# Patient Record
Sex: Female | Born: 1965 | Hispanic: No | Marital: Married
Health system: Midwestern US, Academic
[De-identification: ages and names within clinical notes are randomized; demographics above are authoritative.]

---

## 2020-08-07 ENCOUNTER — Encounter: Admit: 2020-08-07 | Discharge: 2020-08-07 | Payer: BC Managed Care – PPO

## 2020-08-07 NOTE — Telephone Encounter
08/07/20 - Records have been requested per work que task / sjg  ________________________________    1) please collect records from PCP Dr Erskine Emery [phone]     2) please collect records from [facility] hospital  (P) [phone]

## 2020-08-26 ENCOUNTER — Encounter: Admit: 2020-08-26 | Discharge: 2020-08-26 | Payer: BC Managed Care – PPO

## 2020-08-27 ENCOUNTER — Encounter: Admit: 2020-08-27 | Discharge: 2020-08-27 | Payer: BC Managed Care – PPO

## 2020-08-27 DIAGNOSIS — R0602 Shortness of breath: Secondary | ICD-10-CM

## 2020-08-27 DIAGNOSIS — R931 Abnormal findings on diagnostic imaging of heart and coronary circulation: Secondary | ICD-10-CM

## 2020-08-27 DIAGNOSIS — G620 Drug-induced polyneuropathy: Secondary | ICD-10-CM

## 2020-08-27 DIAGNOSIS — Z8249 Family history of ischemic heart disease and other diseases of the circulatory system: Secondary | ICD-10-CM

## 2020-08-27 DIAGNOSIS — R0789 Other chest pain: Secondary | ICD-10-CM

## 2020-08-27 DIAGNOSIS — E7800 Pure hypercholesterolemia, unspecified: Secondary | ICD-10-CM

## 2020-08-27 DIAGNOSIS — Z9189 Other specified personal risk factors, not elsewhere classified: Secondary | ICD-10-CM

## 2020-08-27 DIAGNOSIS — R079 Chest pain, unspecified: Secondary | ICD-10-CM

## 2020-08-27 DIAGNOSIS — K824 Cholesterolosis of gallbladder: Secondary | ICD-10-CM

## 2020-08-27 DIAGNOSIS — R0609 Other forms of dyspnea: Secondary | ICD-10-CM

## 2020-08-27 MED ORDER — CRESTOR 20 MG PO TAB
20 mg | ORAL_TABLET | Freq: Every day | ORAL | 3 refills | 90.00000 days | Status: AC
Start: 2020-08-27 — End: ?

## 2020-08-27 NOTE — Progress Notes
Date of Service: 08/27/2020    Lynn Hall is a 55 y.o. female.       HPI     Lynn Hall is a 55 year old white female that has been experiencing symptoms of shortness of breath, these occur with and without physical activity, she also has been experiencing atypical chest pain, again it is not related to physical activity, it does necessarily radiate across the precordium and it is very short lasting.      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    Patient was evaluated with a calcium score on 12/09/2019, it was 1 and found in the right coronary artery.    Patient was evaluated with a fasting lipid and liver profile, this lab work is dated 08/06/2020, it demonstrated the following: Total cholesterol 256 mg/dL, LDL 161 mg/dL, HDL 46 mg/dL, and triglycerides 096 mg/dL.    Family history: It is significant for coronary artery disease, patient's father did have a myocardial infarction, he underwent CABG and has congestive heart failure.    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  She does not smoke cigarettes and does not drink alcohol.         Vitals:    08/27/20 0841   BP: 128/72   BP Source: Arm, Left Upper   Pulse: 91   SpO2: 97%   O2 Device: None (Room air)   PainSc: Zero   Weight: 72.8 kg (160 lb 9.6 oz)   Height: 157.5 cm (5' 2)     Body mass index is 29.37 kg/m?Marland Kitchen     Past Medical History  Patient Active Problem List    Diagnosis Date Noted   ? Anxiety 08/26/2020   ? Abdominal pain 08/26/2020   ? Depression 08/26/2020   ? Fatigue 08/26/2020   ? GERD without esophagitis 08/26/2020   ? High blood cholesterol 08/26/2020   ? Vertigo 08/26/2020   ? Peripheral neuropathy 08/26/2020         Review of Systems   Constitutional: Negative.   HENT: Negative.    Eyes: Negative.    Cardiovascular: Negative.    Respiratory: Positive for shortness of breath.    Endocrine: Negative.    Hematologic/Lymphatic: Negative.    Skin: Negative.    Musculoskeletal: Negative.    Gastrointestinal: Positive for heartburn.   Genitourinary: Negative.    Neurological: Positive for dizziness and light-headedness.   Psychiatric/Behavioral: Negative.    Allergic/Immunologic: Negative.        Physical Exam  General Appearance: normal in appearance  Skin: warm, moist, no ulcers or xanthomas  Eyes: conjunctivae and lids normal, pupils are equal and round  Lips & Oral Mucosa: no pallor or cyanosis  Neck Veins: neck veins are flat, neck veins are not distended  Chest Inspection: chest is normal in appearance  Respiratory Effort: breathing comfortably, no respiratory distress  Auscultation/Percussion: lungs clear to auscultation, no rales or rhonchi, no wheezing  Cardiac Rhythm: regular rhythm and normal rate  Cardiac Auscultation: S1, S2 normal, no rub, no gallop  Murmurs: no murmur  Carotid Arteries: normal carotid upstroke bilaterally, no bruit  Lower Extremity Edema: no lower extremity edema  Abdominal Exam: soft, non-tender, no masses, bowel sounds normal  Liver & Spleen: no organomegaly  Language and Memory: patient responsive and seems to  comprehend information  Neurologic Exam: neurological assessment grossly intact      Cardiovascular Studies    Twelve-lead EKG demonstrates normal sinus rhythm, no ST segment T wave changes, probably leads V2-V3 inverted, no axis deviation, ventricular rate is 72 bpm.    Cardiovascular Health Factors  Vitals BP Readings from Last 3 Encounters:   08/27/20 128/72   03/20/14 119/68   02/28/14 134/77     Wt Readings from Last 3 Encounters:   08/27/20 72.8 kg (160 lb 9.6 oz)   03/20/14 64 kg (141 lb)   02/28/14 67.5 kg (148 lb 13 oz)     BMI Readings from Last 3 Encounters:   08/27/20 29.37 kg/m?   03/20/14 25.79 kg/m?   02/28/14 27.22 kg/m?      Smoking Social History Tobacco Use   Smoking Status Never Smoker   Smokeless Tobacco Never Used      Lipid Profile No results found for: CHOL  No results found for: HDL  No results found for: LDL  No results found for: TRIG   Blood Sugar No results found for: HGBA1C  Glucose   Date Value Ref Range Status   02/13/2014 79 70 - 100 MG/DL Final   16/11/9602 99 70 - 100 MG/DL Final          Problems Addressed Today  Encounter Diagnoses   Name Primary?   ? Pure hypercholesterolemia Yes   ? Family history of coronary artery bypass graft surgery    ? DOE (dyspnea on exertion)    ? Chest pain, unspecified type    ? High blood cholesterol    ? Drug-induced polyneuropathy (HCC)    ? Family history of coronary artery disease    ? Agatston CAC score, <100    ? Shortness of breath    ? Atypical chest pain    ? Framingham cardiac risk 10-20% in next 10 years        Assessment and Plan     Assessment:    1.  Symptoms of shortness of breath and atypical chest pain-to some extent this could represent an anginal equivalent  2.  Hyperlipidemia-at present time patient is not on statin therapy  3.  Elevated Framingham risk score- estimated 10-year global CVD risk is 10%, patient is in the moderate risk category, it is recommended that the LDL is less than 130 mg/dL  4.  Family history of coronary artery disease-patient's father did suffer an MI, he underwent CABG and presently has congestive heart failure  5.  Patient is moderately overweight-BMI 29.37 kg/m?  6.  History of cervical cancer-patient did undergo chemo/radiation therapy, she is currently in complete remission  7.  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  Start rosuvastatin 20 mg p.o. daily  2.  Overall risk factors modification, low-fat, low-cholesterol, low sugar, low carbohydrate, low-sodium diet  3.  Patient will be evaluated with a 2D echo Doppler study and an exercise stress echocardiogram-we will follow-up on the results of this test and will call the patient with further recommendations  4.  Fasting lipid and liver profile and follow-up office visit in 3 to 4 months.      Total Time Today was 45 minutes in the following activities: Preparing to see the patient, Obtaining and/or reviewing separately obtained history, Performing a medically appropriate examination and/or evaluation, Counseling and educating the patient/family/caregiver, Ordering medications, tests, or procedures, Referring and communication with  other health care professionals (when not separately reported), Documenting clinical information in the electronic or other health record, Independently interpreting results (not separately reported) and communicating results to the patient/family/caregiver and Care coordination (not separately reported)         Current Medications (including today's revisions)  ? ALPRAZolam (XANAX) 0.25 mg tablet Take 0.25 mg by mouth daily as needed.   ? ibuprofen (MOTRIN) 200 mg tablet Take 200 mg by mouth every 6 hours as needed for Pain.   ? ondansetron (ZOFRAN ODT) 8 mg rapid dissolve tablet as Needed.   ? pantoprazole DR (PROTONIX) 40 mg tablet Take 40 mg by mouth daily.   ? pregabalin (LYRICA) 50 mg capsule Take 50 mg by mouth twice daily.

## 2020-08-27 NOTE — Patient Instructions
Crestor  Stress echo  Labs before follow up

## 2020-08-28 ENCOUNTER — Encounter: Admit: 2020-08-28 | Discharge: 2020-08-28 | Payer: BC Managed Care – PPO

## 2020-08-28 MED ORDER — ROSUVASTATIN 20 MG PO TAB
20 mg | ORAL_TABLET | Freq: Every day | ORAL | 3 refills | 90.00000 days | Status: AC
Start: 2020-08-28 — End: ?

## 2020-08-28 NOTE — Telephone Encounter
Pt calling nursing line having difficulty getting rosuvastatin filled from last OV. Pharmacy requesting medication to be reordered. Previously ordered as just brand name only.

## 2020-09-03 ENCOUNTER — Encounter: Admit: 2020-09-03 | Discharge: 2020-09-03 | Payer: BC Managed Care – PPO

## 2020-09-03 NOTE — Telephone Encounter
Message received that the patient's insurance is requesting a peer to peer for the the patient's resting echo.   Carolee Rota Cvm Nurse [place]  Peer to Peer:     Name: Lynn Hall   MRN: [id number]     The above patient's carrier has requested a peer to peer be completed for your patient's 2D + DOPPLER ECHO scheduled on 09/29/2020. Please review the information below:     Ref/Cert#: [id number]   Payor Phone: [phone]   Peer to Peer Deadline Date:09/04/2020   Peer to Peer Reason: did not meet criteria     Please let us know when this is complete and provide the approval number if applicable. It is imperative that the peer to peer be completed before the deadline or the carrier will automatically deny the exam.     Thank you,     Mannie Stabile   [phone]     Payor phone number called as requested. Verified reasons for testing and payor rep gave RQI number [id number] good for echo as of 09/02/2020 until 10/01/2020. This information sent back to [facility].

## 2020-09-29 ENCOUNTER — Ambulatory Visit: Admit: 2020-09-29 | Discharge: 2020-09-29 | Payer: BC Managed Care – PPO

## 2020-09-29 ENCOUNTER — Encounter: Admit: 2020-09-29 | Discharge: 2020-09-29 | Payer: BC Managed Care – PPO

## 2020-09-29 DIAGNOSIS — Z8249 Family history of ischemic heart disease and other diseases of the circulatory system: Secondary | ICD-10-CM

## 2020-09-29 DIAGNOSIS — R079 Chest pain, unspecified: Secondary | ICD-10-CM

## 2020-09-29 DIAGNOSIS — R0609 Other forms of dyspnea: Secondary | ICD-10-CM

## 2020-09-29 DIAGNOSIS — E7800 Pure hypercholesterolemia, unspecified: Secondary | ICD-10-CM

## 2020-09-29 MED ORDER — PERFLUTREN LIPID MICROSPHERES 1.1 MG/ML IV SUSP
1-10 mL | Freq: Once | INTRAVENOUS | 0 refills | Status: CP | PRN
Start: 2020-09-29 — End: ?
  Administered 2020-09-29: 20:00:00 5 mL via INTRAVENOUS

## 2020-10-01 ENCOUNTER — Encounter: Admit: 2020-10-01 | Discharge: 2020-10-01 | Payer: BC Managed Care – PPO

## 2020-10-01 NOTE — Telephone Encounter
-----   Message from Dorris Fetch, MD sent at 09/30/2020  5:10 PM CDT -----  Please call the patient and let her know that the resting echocardiogram and a stress echo did not show any abnormalities, the heart function was normal, it was no evidence of blockages.    I did put her on cholesterol medication and I suggested to recheck the fasting lipid and liver profile in 3 months and return to see me.  If she wants to continue with her PCP on this issue that is fine, if she would like to come back and see me that is fine as well.    Thank you      ----- Message -----  From: Hajj, Alfredo Martinez, MD  Sent: 09/29/2020   4:32 PM CDT  To: Dorris Fetch, MD

## 2020-10-01 NOTE — Telephone Encounter
Left voicemail with test results, reminder of upcoming lab due and scheduled appointment. Callback number provided for further questions or concerns.

## 2020-10-22 ENCOUNTER — Encounter: Admit: 2020-10-22 | Discharge: 2020-10-22 | Payer: BC Managed Care – PPO

## 2021-01-04 ENCOUNTER — Encounter: Admit: 2021-01-04 | Discharge: 2021-01-04 | Payer: BC Managed Care – PPO

## 2021-01-04 DIAGNOSIS — Z8249 Family history of ischemic heart disease and other diseases of the circulatory system: Secondary | ICD-10-CM

## 2021-01-04 DIAGNOSIS — E7800 Pure hypercholesterolemia, unspecified: Secondary | ICD-10-CM

## 2021-01-04 DIAGNOSIS — R079 Chest pain, unspecified: Secondary | ICD-10-CM

## 2021-01-04 DIAGNOSIS — R0609 Other forms of dyspnea: Secondary | ICD-10-CM

## 2021-01-04 LAB — LIVER FUNCTION PANEL
ALBUMIN: 4.1
ALK PHOSPHATASE: 128
ALT: 19
AST: 18
DIRECT BILIRUBIN: 0.1
TOTAL BILIRUBIN: 0.4

## 2021-01-04 LAB — LIPID PROFILE
CHOLESTEROL/HDL %: 3
CHOLESTEROL: 163
HDL: 64
LDL: 81
TRIGLYCERIDES: 93
VLDL: 19

## 2021-01-07 ENCOUNTER — Encounter: Admit: 2021-01-07 | Discharge: 2021-01-07 | Payer: BC Managed Care – PPO

## 2021-03-01 ENCOUNTER — Encounter: Admit: 2021-03-01 | Discharge: 2021-03-01 | Payer: BC Managed Care – PPO

## 2021-03-02 ENCOUNTER — Encounter: Admit: 2021-03-02 | Discharge: 2021-03-02 | Payer: BC Managed Care – PPO

## 2021-05-28 ENCOUNTER — Encounter: Admit: 2021-05-28 | Discharge: 2021-05-28 | Payer: BC Managed Care – PPO

## 2021-05-31 ENCOUNTER — Encounter: Admit: 2021-05-31 | Discharge: 2021-05-31 | Payer: BC Managed Care – PPO

## 2021-05-31 DIAGNOSIS — R053 Chronic cough: Secondary | ICD-10-CM

## 2021-07-15 ENCOUNTER — Encounter: Admit: 2021-07-15 | Discharge: 2021-07-15 | Payer: BC Managed Care – PPO

## 2021-07-15 NOTE — Telephone Encounter
RN attached recs from PCP. RN also requested images to be clouded via [employer]

## 2021-07-19 ENCOUNTER — Encounter: Admit: 2021-07-19 | Discharge: 2021-07-19 | Payer: BC Managed Care – PPO

## 2021-07-19 ENCOUNTER — Ambulatory Visit: Admit: 2021-07-19 | Discharge: 2021-07-19 | Payer: BC Managed Care – PPO

## 2021-07-19 DIAGNOSIS — J329 Chronic sinusitis, unspecified: Secondary | ICD-10-CM

## 2021-07-19 DIAGNOSIS — Z8249 Family history of ischemic heart disease and other diseases of the circulatory system: Secondary | ICD-10-CM

## 2021-07-19 DIAGNOSIS — R053 Chronic cough: Secondary | ICD-10-CM

## 2021-07-19 DIAGNOSIS — K824 Cholesterolosis of gallbladder: Secondary | ICD-10-CM

## 2021-07-19 MED ORDER — ALBUTEROL SULFATE 90 MCG/ACTUATION IN HFAA
2 | RESPIRATORY_TRACT | 11 refills | Status: AC | PRN
Start: 2021-07-19 — End: ?

## 2021-07-19 NOTE — Patient Instructions
Clinic Visit Summary:     Today we discussed your cough in pulmonary clinic.  1) Your lung function testing was largely normal  2) Your CT scan of your chest was also normal  3) I would like you to start sinus rinses 1-2x daily prior to your flonase  4) I have sent an albuterol inhaler to your pharmacy which you can use as needed  5) I would like to follow up in clinic in 2-3 months to reassess your symtpoms  6) I have sent a referral to ENT    It was a pleasure to see you today, please don't hesitate to call with any future questions or concerns.      For questions or concerns,  please contact my Clinical Nurse Coordinator at [phone] or send a My Chart message.     Please contact my nurse with any signs and symptoms of worsening productive cough with thick secretions, blood in sputum, chest pain or tightness, shortness of breath, fever, chills, night sweats, or any other pulmonary concerns.     -  For URGENT issues after business hours, weekends, or holidays: Call [phone] and request for the Pulmonary Fellow to be paged.    -  For medication refills, please ask your pharmacy to send an electronic request.  Please allow at least 3 business days for medication refills.      -  For equipment issues, please contact your Durable Medical Equipment (DME) company directly.    -  To cancel, change, or schedule a clinic appointment: Call Pulmonary Scheduling at [phone].

## 2021-07-19 NOTE — Progress Notes
Pulmonary Clinic Note    Date of Service: 07/19/2021  Referring Physician: Burnadette Pop, MD  PCP: Erskine Emery, MD     Subjective      Ms Wakley is a 55yo female with PMH of cervical cancer s/p chemotherapy c/b peripheral neuropathy  who presents today for evaluation of cough.      She reports walking pneumonia around January, and was treated with steroids and antibiotics.  She continued to have a cough so was given a steroid shot and antibiotics which improved her symptoms.  She thinks that the antibiotics help with her cough.  She got another steroid shot and augmentin about 2 weeks ago which again helped with her symptoms.  Her main symptoms are Abdulwahab Demelo grade fevers, about 99-100 which will be accompanied by a headache.  She will take ibuprofen which sometimes helps with the headache.  She will also have a cough which is worse with exertion.  She will get mildly dyspneic walking up stairs and her chest will feel tight.      She currently uses Flonase twice daily and reports that she has used this for a long time.  She does not know if it helps with her cough at this point.  She has previous use sinus rinses when she has COVID however has not been using them recently.  She does report increased sinus congestion and pressure over the past few months, has never seen ENT.  She does not notice any positional aspect of her cough, denies any nighttime cough and does not have any cough with eating.  She reports a cough occasionally productive of sputum which is yellow in color, has never seen a mopped assist.  She was reported history of tooth abscesses and had 2 teeth removed beginning of this year.  She does think that her symptoms had started prior to the removal of her teeth.    She is a never smoker, denies any recreational drug use.          ROS  Review of Systems   Constitutional: Positive for fever. Negative for chills.   HENT: Negative for congestion and sinus pain.    Eyes: Negative for blurred vision and double vision.   Respiratory: Positive for cough, sputum production and shortness of breath. Negative for hemoptysis.    Cardiovascular: Negative for chest pain, palpitations and leg swelling.   Gastrointestinal: Negative for constipation, diarrhea, nausea and vomiting.   Genitourinary: Negative for dysuria, frequency and urgency.   Musculoskeletal: Negative for joint pain and myalgias.   Skin: Negative for itching and rash.   Neurological: Negative for dizziness and headaches.   Psychiatric/Behavioral: Negative for depression and suicidal ideas.        Objective:         ? albuterol sulfate (PROAIR HFA) 90 mcg/actuation HFA aerosol inhaler Inhale two puffs by mouth into the lungs every 6 hours as needed for Wheezing or Shortness of Breath. Shake well before use.   ? ALPRAZolam (XANAX) 0.25 mg tablet Take one tablet by mouth daily as needed.   ? ibuprofen (MOTRIN) 200 mg tablet Take one tablet by mouth every 6 hours as needed for Pain.   ? ondansetron (ZOFRAN ODT) 8 mg rapid dissolve tablet as Needed.   ? pantoprazole DR (PROTONIX) 40 mg tablet Take one tablet by mouth daily.   ? pregabalin (LYRICA) 50 mg capsule Take one capsule by mouth twice daily.   ? rosuvastatin (CRESTOR) 20 mg tablet Take one tablet by mouth  daily.     Vitals:    07/19/21 1322   BP: 138/75   BP Source: Arm, Left Upper   Pulse: 67   Temp: 36.9 ?C (98.5 ?F)   Resp: 16   SpO2: 99%   PainSc: Zero   Weight: 70.7 kg (155 lb 12.8 oz)   Height: 157.5 cm (5' 2)     Body mass index is 28.5 kg/m?Marland Kitchen     Physical Exam  Vitals reviewed.   Constitutional:       General: She is not in acute distress.  HENT:      Head: Normocephalic and atraumatic.      Right Ear: External ear normal.      Left Ear: External ear normal.      Mouth/Throat:      Mouth: Mucous membranes are moist.      Pharynx: Oropharynx is clear.   Eyes:      Extraocular Movements: Extraocular movements intact.      Pupils: Pupils are equal, round, and reactive to light.   Cardiovascular:      Rate and Rhythm: Normal rate.      Heart sounds: No murmur heard.     No friction rub. No gallop.   Pulmonary:      Effort: Pulmonary effort is normal.      Breath sounds: No stridor. No wheezing or rales.   Abdominal:      General: Abdomen is flat. Bowel sounds are normal. There is no distension.      Palpations: Abdomen is soft.      Tenderness: There is no abdominal tenderness.   Musculoskeletal:      Cervical back: Normal range of motion and neck supple.      Right lower leg: No edema.      Left lower leg: No edema.   Skin:     General: Skin is warm and dry.      Findings: No rash.   Neurological:      General: No focal deficit present.      Mental Status: She is alert and oriented to person, place, and time.      Cranial Nerves: No cranial nerve deficit (grossly).                  Assessment and Plan:  Chronic Cough: Patient presents today with about 104-month history of episodic cough which will improve with steroids and antibiotics.  CXR 03/26/21 without infiltrate/consolidation.  CT Chest 04/09/21 without parenchymal disease.  PFTs today do not show any evidence of obstructive pattern, do show mild restriction with TLC 77% and a symmetric reduction in FEV1 and FVC on spirometry, DLCO was normal at 92%.  Of note she does have intermittent truncation of her inspiratory loop which could suggest vocal cord dysfunction as a cause of her cough.  Given her normal CT scan, the likelihood of fibrotic lung disease as a cause of her restriction and symptoms is unlikely.  She does report some sinus congestion and pressure and combined with her inspiratory loop truncation, I would presume postnasal drip or recurrent sinusitis is most likely cause of her cough.  She is already on intranasal steroids with Flonase twice daily, we discussed adding sinus rinses as well.  We will also place referral for ENT for further evaluation.  We can try short acting bronchodilator to see if this improves her symptoms, if she does have improvement on albuterol then could discuss starting an inhaled steroid.  -Sinus rinses 1-2  times daily prior to [facility]  -ENT referral placed  -Albuterol inhaler ordered      Follow up in 3 months or sooner as needed.        Jule Economy, MD

## 2021-12-21 ENCOUNTER — Encounter: Admit: 2021-12-21 | Discharge: 2021-12-21

## 2021-12-21 ENCOUNTER — Inpatient Hospital Stay: Admit: 2021-12-21 | Discharge: 2021-12-21

## 2021-12-21 ENCOUNTER — Ambulatory Visit: Admit: 2021-12-21 | Discharge: 2021-12-21

## 2021-12-21 ENCOUNTER — Inpatient Hospital Stay: Admit: 2021-12-21

## 2021-12-21 DIAGNOSIS — J9601 Acute respiratory failure with hypoxia: Secondary | ICD-10-CM

## 2021-12-21 DIAGNOSIS — Z9889 Other specified postprocedural states: Secondary | ICD-10-CM

## 2021-12-21 DIAGNOSIS — D62 Acute posthemorrhagic anemia: Secondary | ICD-10-CM

## 2021-12-21 DIAGNOSIS — E872 Lactic acidosis: Secondary | ICD-10-CM

## 2021-12-21 DIAGNOSIS — I10 Essential (primary) hypertension: Secondary | ICD-10-CM

## 2021-12-21 DIAGNOSIS — I469 Cardiac arrest, cause unspecified: Secondary | ICD-10-CM

## 2021-12-21 DIAGNOSIS — I2510 Atherosclerotic heart disease of native coronary artery without angina pectoris: Secondary | ICD-10-CM

## 2021-12-21 DIAGNOSIS — E785 Hyperlipidemia, unspecified: Secondary | ICD-10-CM

## 2021-12-21 DIAGNOSIS — R739 Hyperglycemia, unspecified: Secondary | ICD-10-CM

## 2021-12-21 DIAGNOSIS — R578 Other shock: Secondary | ICD-10-CM

## 2021-12-21 DIAGNOSIS — J810 Acute pulmonary edema: Secondary | ICD-10-CM

## 2021-12-21 DIAGNOSIS — Z9911 Dependence on respirator [ventilator] status: Secondary | ICD-10-CM

## 2021-12-21 DIAGNOSIS — I2111 ST elevation (STEMI) myocardial infarction involving right coronary artery: Secondary | ICD-10-CM

## 2021-12-21 DIAGNOSIS — I213 ST elevation (STEMI) myocardial infarction of unspecified site: Secondary | ICD-10-CM

## 2021-12-21 DIAGNOSIS — K824 Cholesterolosis of gallbladder: Secondary | ICD-10-CM

## 2021-12-21 DIAGNOSIS — Z9281 Personal history of extracorporeal membrane oxygenation (ECMO): Secondary | ICD-10-CM

## 2021-12-21 DIAGNOSIS — K7200 Acute and subacute hepatic failure without coma: Secondary | ICD-10-CM

## 2021-12-21 DIAGNOSIS — E876 Hypokalemia: Secondary | ICD-10-CM

## 2021-12-21 DIAGNOSIS — R748 Abnormal levels of other serum enzymes: Secondary | ICD-10-CM

## 2021-12-21 DIAGNOSIS — Z8249 Family history of ischemic heart disease and other diseases of the circulatory system: Secondary | ICD-10-CM

## 2021-12-21 DIAGNOSIS — R570 Cardiogenic shock: Secondary | ICD-10-CM

## 2021-12-21 LAB — COMPREHENSIVE METABOLIC PANEL
ALBUMIN: 2.8 g/dL — ABNORMAL LOW (ref 3.5–5.0)
ALBUMIN: 1.8 g/dL — ABNORMAL LOW (ref 3.5–5.0)
ALBUMIN: 3 g/dL — ABNORMAL LOW (ref 3.5–5.0)
ALK PHOSPHATASE: 175 U/L — ABNORMAL HIGH (ref 25–110)
ALK PHOSPHATASE: 48 U/L — ABNORMAL LOW (ref 25–110)
ALK PHOSPHATASE: 53 U/L (ref 25–110)
ALK PHOSPHATASE: 50 U/L (ref 25–110)
ALT: 360 U/L — ABNORMAL HIGH (ref 7–56)
ALT: 205 U/L — ABNORMAL HIGH (ref 7–56)
ALT: 281 U/L — ABNORMAL HIGH (ref 7–56)
ANION GAP: 34 K/UL — ABNORMAL HIGH (ref 3–12)
ANION GAP: 20 — ABNORMAL HIGH (ref 3–12)
ANION GAP: 16 — ABNORMAL HIGH (ref 3–12)
AST: 277 U/L — ABNORMAL HIGH (ref 7–40)
AST: 579 U/L — ABNORMAL HIGH (ref 7–40)
AST: 661 U/L — ABNORMAL HIGH (ref 7–40)
BLD UREA NITROGEN: 22 mg/dL (ref 7–25)
CALCIUM: 10 mg/dL (ref 8.5–10.6)
CALCIUM: 8.6 mg/dL (ref 8.5–10.6)
CHLORIDE: 107 MMOL/L — ABNORMAL LOW (ref 98–110)
CO2: 17 MMOL/L — ABNORMAL LOW (ref 21–30)
CO2: 23 MMOL/L (ref 21–30)
CO2: 24 MMOL/L (ref 21–30)
CREATININE: 1.2 mg/dL — ABNORMAL HIGH (ref 0.4–1.00)
CREATININE: 1.1 mg/dL — ABNORMAL HIGH (ref >60)
CREATININE: 1.2 mg/dL — ABNORMAL HIGH (ref 0.4–1.00)
EGFR: >60 mL/min (ref >60)
EGFR: 53 mL/min — ABNORMAL LOW (ref >60)
EGFR: 51 mL/min — ABNORMAL LOW (ref >60)
EGFR: 58 mL/min — ABNORMAL LOW (ref >60)
EGFR: 53 mL/min — ABNORMAL LOW (ref >60)
GLUCOSE,PANEL: 340 mg/dL — ABNORMAL HIGH (ref 70–100)
GLUCOSE,PANEL: 321 mg/dL — ABNORMAL HIGH (ref 70–100)
POTASSIUM: 2.9 MMOL/L — ABNORMAL LOW (ref 3.5–5.1)
SODIUM: 145 MMOL/L — ABNORMAL HIGH (ref 137–147)
SODIUM: 158 MMOL/L — ABNORMAL HIGH (ref 137–147)
SODIUM: 152 MMOL/L — ABNORMAL HIGH (ref 137–147)
SODIUM: 156 MMOL/L — ABNORMAL HIGH (ref 137–147)
SODIUM: 152 MMOL/L — ABNORMAL HIGH (ref 137–147)
TOTAL BILIRUBIN: 0.5 mg/dL (ref 0.3–1.2)
TOTAL BILIRUBIN: 1.3 mg/dL — ABNORMAL HIGH (ref 0.3–1.2)
TOTAL PROTEIN: 5.3 g/dL — ABNORMAL LOW (ref 6.0–8.0)
TOTAL PROTEIN: 3 g/dL — ABNORMAL LOW (ref 6.0–8.0)
TOTAL PROTEIN: 4.4 g/dL — ABNORMAL LOW (ref 6.0–8.0)

## 2021-12-21 LAB — GRAM STAIN

## 2021-12-21 LAB — BLOOD GASES, ARTERIAL
BASE DEFICIT-ART: 0.3 MMOL/L
BICARB, ART(CAL): 24 MMOL/L (ref 21–28)
O2 SAT,ART(CALC.): 100 % — ABNORMAL HIGH (ref 95–99)
PCO2-ART: 38 mmHg (ref 35–45)
PCO2-ART: 38 mmHg (ref 35–45)
PH-ART: 7.1 MMOL/L — CL (ref 7.35–7.45)
PH-ART: 7.4 (ref 7.35–7.45)
PH-ART: 7.3 pg (ref 7.35–7.45)
PH-ART: 7.4 (ref 7.35–7.45)
PH-ART: 7.4 (ref 7.35–7.45)
PO2-ART: 64 mmHg — ABNORMAL LOW (ref 80–100)
PO2-ART: 274 mmHg — ABNORMAL HIGH (ref 80–100)

## 2021-12-21 LAB — POC BLOOD GAS ARTERIAL
BASE DEF ART POC: 12 MMOL/L — ABNORMAL HIGH (ref 1.8–7.0)
BASE DEF ART POC: 14 MMOL/L
BASE DEF ART POC: 3 MMOL/L
BASE EXCESS, ART POC: 0 MMOL/L
BICARB, ART POC: 14 MMOL/L — ABNORMAL LOW (ref 21–28)
BICARB, ART POC: 21 MMOL/L (ref 21–28)
BICARB, ART POC: 21 MMOL/L (ref 21–28)
BICARB, ART POC: 24 MMOL/L (ref 21–28)
BICARB, ART POC: 26 MMOL/L (ref 21–28)
PCO2, ART POC: 56 mmHg — ABNORMAL HIGH (ref >60)
PCO2, ART POC: 39 mmHg (ref 35–45)
PCO2, ART POC: 53 mmHg — ABNORMAL HIGH (ref 35–45)
PCO2, ART POC: 55 mmHg — ABNORMAL HIGH (ref 35–45)
PCO2, ART POC: 35 mmHg (ref 35–45)
PCO2, ART POC: 27 mmHg — ABNORMAL LOW (ref 35–45)
PCO2, ART POC: 32 mmHg — ABNORMAL LOW (ref 35–45)
PCO2, ART POC: 40 mmHg (ref 35–45)
PCO2, ART POC: 41 mmHg (ref 35–45)
PCO2, ART POC: 37 mmHg (ref 35–45)
PCO2, ART POC: 46 mmHg — ABNORMAL HIGH (ref 35–45)
PH, ART POC: 7.1 % — CL (ref 7.35–7.45)
PH, ART POC: 7 — CL (ref 7.35–7.45)
PH, ART POC: 7.1 — CL (ref 7.35–7.45)
PH, ART POC: 7.2 — ABNORMAL LOW (ref 7.35–7.45)
PH, ART POC: 7.4 (ref 7.35–7.45)
PH, ART POC: 7.5 — ABNORMAL HIGH (ref 7.35–7.45)
PH, ART POC: 7.4 (ref 7.35–7.45)
PH, ART POC: 7.3 (ref 7.35–7.45)
PH, ART POC: 7.3 (ref 7.35–7.45)
PH, ART POC: 7.3 (ref 7.35–7.45)
PH, ART POC: 7.3 (ref 7.35–7.45)
PO2, ART POC: 69 mmHg — ABNORMAL LOW (ref 80–100)
PO2, ART POC: 50 mmHg — ABNORMAL LOW (ref 80–100)
PO2, ART POC: 89 mmHg (ref 80–100)
PO2, ART POC: 39 mmHg — CL (ref 80–100)
PO2, ART POC: 506 mmHg — ABNORMAL HIGH (ref 80–100)
PO2, ART POC: 595 mmHg — ABNORMAL HIGH (ref 80–100)
PO2, ART POC: 542 mmHg — ABNORMAL HIGH (ref 80–100)
PO2, ART POC: 53 mmHg — ABNORMAL LOW (ref 80–100)

## 2021-12-21 LAB — POC POTASSIUM
POTASSIUM, POC: 3.2 MMOL/L — ABNORMAL LOW (ref 3.5–5.1)
POTASSIUM, POC: 3.6 MMOL/L (ref 3.5–5.1)
POTASSIUM, POC: 3.8 MMOL/L (ref 3.5–5.1)
POTASSIUM, POC: 2.8 MMOL/L — ABNORMAL LOW (ref 3.5–5.1)
POTASSIUM, POC: 2.3 MMOL/L — CL (ref 3.5–5.1)
POTASSIUM, POC: 2.2 MMOL/L — CL (ref 3.5–5.1)
POTASSIUM, POC: 2.1 MMOL/L — CL (ref 3.5–5.1)
POTASSIUM, POC: 2.3 MMOL/L — CL (ref 3.5–5.1)
POTASSIUM, POC: 2.8 MMOL/L — ABNORMAL LOW (ref 3.5–5.1)
POTASSIUM, POC: 3 MMOL/L — ABNORMAL LOW (ref 3.5–5.1)

## 2021-12-21 LAB — O2 SATURATION, MIXED VENOUS: O2 SAT, MIXED VEN: 96 % (ref 7–25)

## 2021-12-21 LAB — PHOSPHORUS
PHOSPHORUS: 8 mg/dL — ABNORMAL HIGH (ref 2.0–4.5)
PHOSPHORUS: 1.5 mg/dL — ABNORMAL LOW (ref 2.0–4.5)
PHOSPHORUS: 1.9 mg/dL — ABNORMAL LOW (ref 2.0–4.5)
PHOSPHORUS: 2.7 mg/dL (ref 2.0–4.5)

## 2021-12-21 LAB — POC GLUCOSE
POC GLUCOSE: 365 mg/dL — ABNORMAL HIGH (ref 70–100)
POC GLUCOSE: 323 mg/dL — ABNORMAL HIGH (ref 70–100)
POC GLUCOSE: 333 mg/dL — ABNORMAL HIGH (ref 70–100)
POC GLUCOSE: 327 mg/dL — ABNORMAL HIGH (ref 70–100)
POC GLUCOSE: 334 mg/dL — ABNORMAL HIGH (ref 70–100)
POC GLUCOSE: 97 mg/dL (ref 70–100)
POC GLUCOSE: 330 mg/dL — ABNORMAL HIGH (ref 70–100)
POC GLUCOSE: 330 mg/dL — ABNORMAL HIGH (ref 70–100)
POC GLUCOSE: 323 mg/dL — ABNORMAL HIGH (ref 70–100)
POC GLUCOSE: 304 mg/dL — ABNORMAL HIGH (ref 70–100)
POC GLUCOSE: 276 mg/dL — ABNORMAL HIGH (ref 70–100)
POC GLUCOSE: 243 mg/dL — ABNORMAL HIGH (ref 70–100)

## 2021-12-21 LAB — MAGNESIUM
MAGNESIUM: 2.1 mg/dL — ABNORMAL LOW (ref 1.6–2.6)
MAGNESIUM: 1.8 mg/dL — ABNORMAL LOW (ref 1.6–2.6)
MAGNESIUM: 2.1 mg/dL — CL (ref 1.6–2.6)
MAGNESIUM: 1.6 mg/dL — ABNORMAL HIGH (ref 1.6–2.6)

## 2021-12-21 LAB — POC HEMATOCRIT
HEMATOCRIT POC: 49 % — ABNORMAL HIGH (ref 36–45)
HEMATOCRIT POC: 33 % — ABNORMAL LOW (ref 36–45)
HEMATOCRIT POC: 46 % — ABNORMAL HIGH (ref 36–45)
HEMATOCRIT POC: 21 % — ABNORMAL LOW (ref 36–45)
HEMATOCRIT POC: 22 % — ABNORMAL LOW (ref 36–45)
HEMATOCRIT POC: 21 % — ABNORMAL LOW (ref 36–45)
HEMATOCRIT POC: 16 % — ABNORMAL LOW (ref 36–45)
HEMATOCRIT POC: 28 % — ABNORMAL LOW (ref 36–45)
HEMATOCRIT POC: 27 % — ABNORMAL LOW (ref 36–45)
HEMATOCRIT POC: 27 % — ABNORMAL LOW (ref 36–45)
HEMATOCRIT POC: 27 % — ABNORMAL LOW (ref 36–45)
HEMOGLOBIN POC: 16 g/dL — ABNORMAL HIGH (ref 12.0–15.0)
HEMOGLOBIN POC: 11 g/dL — ABNORMAL LOW (ref 12.0–15.0)
HEMOGLOBIN POC: 15 g/dL — ABNORMAL HIGH (ref 12.0–15.0)
HEMOGLOBIN POC: 7.1 g/dL — ABNORMAL LOW (ref 12.0–15.0)
HEMOGLOBIN POC: 7.5 g/dL — ABNORMAL LOW (ref 12.0–15.0)
HEMOGLOBIN POC: 7.1 g/dL — ABNORMAL LOW (ref 12.0–15.0)
HEMOGLOBIN POC: 5.4 g/dL — CL (ref 12.0–15.0)
HEMOGLOBIN POC: 9.5 g/dL — ABNORMAL LOW (ref 12.0–15.0)
HEMOGLOBIN POC: 9.2 g/dL — ABNORMAL LOW (ref 12.0–15.0)
HEMOGLOBIN POC: 9.2 g/dL — ABNORMAL LOW (ref 12.0–15.0)
HEMOGLOBIN POC: 9.2 g/dL — ABNORMAL LOW (ref 12.0–15.0)

## 2021-12-21 LAB — CREATINE KINASE-CPK
CK TOTAL: 178 U/L — ABNORMAL HIGH (ref 21–215)
CK TOTAL: 964 U/L — ABNORMAL HIGH (ref 21–215)

## 2021-12-21 LAB — URINALYSIS MICROSCOPIC REFLEX TO CULTURE

## 2021-12-21 LAB — CBC
MCH: 29 pg (ref 26–34)
MCHC: 33 g/dL — ABNORMAL HIGH (ref 32.0–36.0)
MCV: 87 FL — ABNORMAL LOW (ref 80–100)
MPV: 7.6 FL — ABNORMAL HIGH (ref 7–11)
PLATELET COUNT: 99 K/UL — ABNORMAL LOW (ref 150–400)
RDW: 14 % (ref 11–15)
WBC COUNT: 8.8 K/UL — ABNORMAL HIGH (ref 4.5–11.0)
WBC COUNT: 9.9 K/UL — ABNORMAL HIGH (ref 4.5–11.0)
WBC COUNT: 8.8 K/UL (ref 4.5–11.0)

## 2021-12-21 LAB — COVID-19 (SARS-COV-2) PCR

## 2021-12-21 LAB — O2HGB SAT-VENOUS POC
O2HGB SAT-VENOUS POC: 22 % — ABNORMAL LOW (ref 55–71)
O2HGB SAT-VENOUS POC: 23 % — ABNORMAL LOW (ref 55–71)
O2HGB SAT-VENOUS POC: 27 % — ABNORMAL LOW (ref 55–71)
O2HGB SAT-VENOUS POC: 29 % — ABNORMAL LOW (ref 55–71)
O2HGB SAT-VENOUS POC: 65 % (ref 55–71)
O2HGB SAT-VENOUS POC: 74 % — ABNORMAL HIGH (ref 55–71)
O2HGB SAT-VENOUS POC: 77 % — ABNORMAL HIGH (ref 55–71)

## 2021-12-21 LAB — FIBRINOGEN
FIBRINOGEN: 118 mg/dL — ABNORMAL LOW (ref 200–400)
FIBRINOGEN: 130 mg/dL — ABNORMAL LOW (ref 200–400)
FIBRINOGEN: 146 mg/dL — ABNORMAL LOW (ref 200–400)

## 2021-12-21 LAB — BARBITURATES-URINE RANDOM: BARBITURATES: NEGATIVE

## 2021-12-21 LAB — POC IONIZED CALCIUM
IONIZED CALCIUM,POC: 0.7 MMOL/L — ABNORMAL LOW (ref 1.0–1.3)
IONIZED CALCIUM,POC: 1 MMOL/L (ref 1.0–1.3)
IONIZED CALCIUM,POC: 1.4 MMOL/L — ABNORMAL HIGH (ref 1.0–1.3)
IONIZED CALCIUM,POC: 1 MMOL/L (ref 1.0–1.3)
IONIZED CALCIUM,POC: 0.8 MMOL/L — ABNORMAL LOW (ref 1.0–1.3)
IONIZED CALCIUM,POC: 1.1 MMOL/L — ABNORMAL LOW (ref 1.0–1.3)

## 2021-12-21 LAB — HIGH SENSITIVITY TROPONIN I 0 HOUR: HIGH SENSITIVITY TROPONIN I 0 HOUR: 660 ng/L — ABNORMAL HIGH (ref <12)

## 2021-12-21 LAB — BENZODIAZEPINES-URINE RANDOM: BENZODIAZEPINES: NEGATIVE

## 2021-12-21 LAB — POC SODIUM
SODIUM, POC: 137 MMOL/L (ref 137–147)
SODIUM, POC: 142 MMOL/L (ref 137–147)
SODIUM, POC: 142 MMOL/L (ref 137–147)
SODIUM, POC: 151 MMOL/L — ABNORMAL HIGH (ref 137–147)
SODIUM, POC: 152 MMOL/L — ABNORMAL HIGH (ref 137–147)
SODIUM, POC: 150 MMOL/L — ABNORMAL HIGH (ref 137–147)
SODIUM, POC: 150 MMOL/L — ABNORMAL HIGH (ref 137–147)
SODIUM, POC: 150 MMOL/L — ABNORMAL HIGH (ref 137–147)
SODIUM, POC: 149 MMOL/L — ABNORMAL HIGH (ref 137–147)
SODIUM, POC: 152 MMOL/L — ABNORMAL HIGH (ref 137–147)
SODIUM, POC: 151 MMOL/L — ABNORMAL HIGH (ref 137–147)

## 2021-12-21 LAB — PROTIME INR (PT)
PROTIME: 17 s — ABNORMAL HIGH (ref 9.5–14.2)
PROTIME: 19 s — ABNORMAL HIGH (ref 9.5–14.2)
PROTIME: 20 s — ABNORMAL HIGH (ref 9.5–14.2)
PROTIME: 20 s — ABNORMAL HIGH (ref 9.5–14.2)

## 2021-12-21 LAB — CBC AND DIFF
ABSOLUTE BASO COUNT: 0 K/UL (ref 0–0.20)
ABSOLUTE EOS COUNT: 0 K/UL (ref 0–0.45)
ABSOLUTE MONO COUNT: 0.1 K/UL (ref 0–0.80)
WBC COUNT: 31 K/UL — ABNORMAL HIGH (ref 4.5–11.0)
WBC COUNT: 9.2 K/UL — ABNORMAL LOW (ref 4.5–11.0)

## 2021-12-21 LAB — POTASSIUM: POTASSIUM: 3.7 MMOL/L (ref 3.5–5.1)

## 2021-12-21 LAB — LACTIC ACID (BG - RAPID LACTATE)
LACTIC ACID(SYRINGE): 8.2 MMOL/L — ABNORMAL HIGH (ref 0.5–2.0)
LACTIC ACID(SYRINGE): 17 MMOL/L — ABNORMAL HIGH (ref 0.5–2.0)

## 2021-12-21 LAB — INFLUENZA A/B AND RSV PCR
FLU A: NEGATIVE — AB
FLU B: NEGATIVE (ref 0–5)
RSV: NEGATIVE

## 2021-12-21 LAB — IONIZED CALCIUM
IONIZED CALCIUM: 0.9 MMOL/L — ABNORMAL LOW (ref 1.0–1.3)
IONIZED CALCIUM: 1.1 MMOL/L (ref 1.0–1.3)
IONIZED CALCIUM: 1.1 MMOL/L — ABNORMAL HIGH (ref 1.0–1.3)
IONIZED CALCIUM: 1.1 MMOL/L — ABNORMAL LOW (ref 1.0–1.3)

## 2021-12-21 LAB — URINALYSIS DIPSTICK REFLEX TO CULTURE: NITRITE: NEGATIVE

## 2021-12-21 LAB — COCAINE-URINE RANDOM: COCAINE: NEGATIVE

## 2021-12-21 LAB — FENTANYL URINE: FENTANYL URINE: POSITIVE — AB

## 2021-12-21 LAB — PTT (APTT)
PTT: 85 s — ABNORMAL HIGH (ref 24.0–36.5)
PTT: 94 s — ABNORMAL HIGH (ref 24.0–36.5)
PTT: 80 s — ABNORMAL HIGH (ref 24.0–36.5)

## 2021-12-21 LAB — TSH WITH FREE T4 REFLEX: TSH: 2.6 uU/mL — ABNORMAL HIGH (ref 0.35–5.00)

## 2021-12-21 LAB — PHENCYCLIDINES-URINE RANDOM: PHENCYCLIDINE (PCP): NEGATIVE

## 2021-12-21 LAB — LDH-LACTATE DEHYDROGENASE: LDH: 970 U/L — ABNORMAL HIGH (ref 100–210)

## 2021-12-21 LAB — BETA HYDROXYBUTYRATE (KETONES): BETA HYDROXYBUTYRATE: 0.3 MMOL/L — ABNORMAL HIGH (ref <0.3)

## 2021-12-21 LAB — AMPHETAMINES-URINE RANDOM: AMPHETAMINES: NEGATIVE

## 2021-12-21 LAB — CANNABINOIDS-URINE RANDOM: CANNABINOIDS (THC): NEGATIVE

## 2021-12-21 LAB — ALCOHOL LEVEL: ALCOHOL: <10 mg/dL

## 2021-12-21 LAB — SALICYLATE LEVEL

## 2021-12-21 LAB — LACTIC ACID(LACTATE)
LACTIC ACID: 11 MMOL/L — ABNORMAL HIGH (ref 0.5–2.0)
LACTIC ACID: 15 MMOL/L — ABNORMAL HIGH (ref 0.5–2.0)
LACTIC ACID: 7.2 MMOL/L — ABNORMAL HIGH (ref 0.5–2.0)

## 2021-12-21 LAB — PLASMA FREE HEMOGLOBIN: TOTAL PLASMA HEMOGLOBIN: 80 mg/dL — ABNORMAL HIGH (ref 0–40)

## 2021-12-21 LAB — OPIATES-URINE RANDOM: OPIATES: NEGATIVE

## 2021-12-21 LAB — ACETAMINOPHEN LEVEL: ACETAMINOPHEN: <10 ug/mL (ref <20.1)

## 2021-12-21 LAB — HEMOGLOBIN A1C: HEMOGLOBIN A1C: 6.2 % — ABNORMAL HIGH (ref 4.0–5.7)

## 2021-12-21 LAB — MRSA PNEUMONIA SCREEN

## 2021-12-21 LAB — PROCALCITONIN: PROCALCITONIN: 0.3 ng/mL

## 2021-12-21 MED ORDER — SODIUM CHLORIDE 0.9 % IV SOLP
INTRAVENOUS | 0 refills | Status: DC
Start: 2021-12-21 — End: 2021-12-22

## 2021-12-21 MED ORDER — BUMETANIDE 0.25 MG/ML IJ SOLN
2 mg | Freq: Once | INTRAVENOUS | 0 refills | Status: DC
Start: 2021-12-21 — End: 2021-12-21

## 2021-12-21 MED ORDER — NALOXONE 0.4 MG/ML IJ SOLN
.08 mg | INTRAVENOUS | 0 refills | Status: AC | PRN
Start: 2021-12-21 — End: ?

## 2021-12-21 MED ORDER — HEPARIN (PORCINE) IN 5 % DEX 20,000 UNIT/500 ML (40 UNIT/ML) IV SOLP
0-2000 [IU]/h | INTRAVENOUS | 0 refills | Status: AC
Start: 2021-12-21 — End: ?

## 2021-12-21 MED ORDER — SODIUM BICARBONATE 8.4 % (1 MEQ/ML) IV SYRG
0 refills | Status: CP
Start: 2021-12-21 — End: ?

## 2021-12-21 MED ORDER — POTASSIUM CHLORIDE IN WATER 10 MEQ/50 ML IV PGBK
10 meq | INTRAVENOUS | 0 refills | Status: CP
Start: 2021-12-21 — End: ?
  Administered 2021-12-22: 10 meq via INTRAVENOUS

## 2021-12-21 MED ORDER — ALBUMIN, HUMAN 5 % IV SOLP
250 mL | Freq: Once | INTRAVENOUS | 0 refills | Status: CP
Start: 2021-12-21 — End: ?
  Administered 2021-12-21: 22:00:00 250 mL via INTRAVENOUS

## 2021-12-21 MED ORDER — LEVETIRACETAM 500 MG/5 ML IV SOLN
750 mg | Freq: Two times a day (BID) | INTRAVENOUS | 0 refills | Status: AC
Start: 2021-12-21 — End: ?
  Administered 2021-12-22 – 2021-12-23 (×4): 750 mg via INTRAVENOUS

## 2021-12-21 MED ORDER — ALUM-MAG HYDROXIDE-SIMETH 200-200-20 MG/5 ML PO SUSP
30 mL | ORAL | 0 refills | Status: AC | PRN
Start: 2021-12-21 — End: ?

## 2021-12-21 MED ORDER — VASOPRESSIN 0.2 UNIT/ML IV SOLN
.6-2.4 [IU]/h | INTRAVENOUS | 0 refills | Status: AC
Start: 2021-12-21 — End: ?
  Administered 2021-12-22 – 2021-12-23 (×3): 2.4 [IU]/h via INTRAVENOUS

## 2021-12-21 MED ORDER — ACETAMINOPHEN 650 MG RE SUPP
650 mg | RECTAL | 0 refills | Status: AC | PRN
Start: 2021-12-21 — End: ?

## 2021-12-21 MED ORDER — DEXMEDETOMIDINE IN 0.9 % NACL 400 MCG/100 ML (4 MCG/ML) IV SOLN
.2-1 ug/kg/h | INTRAVENOUS | 0 refills | Status: AC
Start: 2021-12-21 — End: ?

## 2021-12-21 MED ORDER — ASPIRIN 300 MG RE SUPP
150 mg | Freq: Every day | RECTAL | 0 refills | Status: AC
Start: 2021-12-21 — End: ?
  Administered 2021-12-22: 15:00:00 150 mg via RECTAL

## 2021-12-21 MED ORDER — ELECTROLYTE-A IV SOLP
1000 mL | Freq: Once | INTRAVENOUS | 0 refills | Status: CP
Start: 2021-12-21 — End: ?
  Administered 2021-12-22: 04:00:00 1000 mL via INTRAVENOUS

## 2021-12-21 MED ORDER — CHLORHEXIDINE GLUCONATE 0.12 % MM MWSH
15 mL | Freq: Two times a day (BID) | 0 refills | Status: AC
Start: 2021-12-21 — End: ?
  Administered 2021-12-22 – 2021-12-23 (×4): 15 mL

## 2021-12-21 MED ORDER — ELECTROLYTE-A IV SOLP
500 mL | INTRAVENOUS | 0 refills | Status: AC
Start: 2021-12-21 — End: ?
  Administered 2021-12-22 – 2021-12-23 (×2): 500 mL via INTRAVENOUS

## 2021-12-21 MED ORDER — ASPIRIN 81 MG PO CHEW
81 mg | Freq: Every day | ORAL | 0 refills | Status: DC
Start: 2021-12-21 — End: 2021-12-21

## 2021-12-21 MED ORDER — ONDANSETRON HCL (PF) 4 MG/2 ML IJ SOLN
4 mg | INTRAVENOUS | 0 refills | Status: AC | PRN
Start: 2021-12-21 — End: ?

## 2021-12-21 MED ORDER — ACETAMINOPHEN 325 MG PO TAB
650 mg | ORAL | 0 refills | Status: AC | PRN
Start: 2021-12-21 — End: ?

## 2021-12-21 MED ORDER — DEXTROSE 50 % IN WATER (D50W) IV SYRG
12.5-25 g | INTRAVENOUS | 0 refills | Status: AC | PRN
Start: 2021-12-21 — End: ?

## 2021-12-21 MED ORDER — BISACODYL 10 MG RE SUPP
10 mg | Freq: Every day | RECTAL | 0 refills | Status: AC | PRN
Start: 2021-12-21 — End: ?

## 2021-12-21 MED ORDER — ACETAMINOPHEN 500 MG PO TAB
1000 mg | ORAL | 0 refills | Status: AC
Start: 2021-12-21 — End: ?
  Administered 2021-12-22 (×3): 1000 mg via ORAL

## 2021-12-21 MED ORDER — CLOPIDOGREL 300 MG PO TAB
600 mg | Freq: Once | OROGASTRIC | 0 refills | Status: DC
Start: 2021-12-21 — End: 2021-12-21

## 2021-12-21 MED ORDER — MAGNESIUM OXIDE 400 MG (241.3 MG MAGNESIUM) PO TAB
200-600 mg | ORAL | 0 refills | Status: DC | PRN
Start: 2021-12-21 — End: 2021-12-22

## 2021-12-21 MED ORDER — INSULIN REGULAR IN 0.9 % NACL 100 UNIT/100 ML (1 UNIT/ML) IV SOLN
1-32 [IU]/h | INTRAVENOUS | 0 refills | Status: AC
Start: 2021-12-21 — End: ?
  Administered 2021-12-21: 21:00:00 3 [IU]/h via INTRAVENOUS
  Administered 2021-12-22: 08:00:00 6.5 [IU]/h via INTRAVENOUS

## 2021-12-21 MED ORDER — AMIODARONE IN DEXTROSE,ISO-OSM 360 MG/200 ML (1.8 MG/ML) IV SOLN
1 mg/min | INTRAVENOUS | 0 refills | Status: AC
Start: 2021-12-21 — End: ?
  Administered 2021-12-22 (×3): 1 mg/min via INTRAVENOUS
  Administered 2021-12-23 (×2): 0.5 mg/min via INTRAVENOUS

## 2021-12-21 MED ORDER — ASPIRIN 81 MG PO CHEW
81 mg | Freq: Every day | OROGASTRIC | 0 refills | Status: DC
Start: 2021-12-21 — End: 2021-12-21

## 2021-12-21 MED ORDER — POTASSIUM CHLORIDE IN WATER 10 MEQ/50 ML IV PGBK
10 meq | INTRAVENOUS | 0 refills | Status: CP
Start: 2021-12-21 — End: ?
  Administered 2021-12-21: 14:00:00 10 meq via INTRAVENOUS

## 2021-12-21 MED ORDER — ANGIOTENSIN II IV DRIP (STD CONC)
0-80 ng/kg/min | INTRAVENOUS | 0 refills | Status: DC
Start: 2021-12-21 — End: 2021-12-21

## 2021-12-21 MED ORDER — FENTANYL DRIP IN NS 1000MCG/100ML
10-70 ug/h | INTRAVENOUS | 0 refills | Status: DC
Start: 2021-12-21 — End: 2021-12-21

## 2021-12-21 MED ORDER — LIDOCAINE (PF) 100 MG/5 ML (2 %) IV SYRG
1 mg/kg | INTRAVENOUS | 0 refills | Status: AC | PRN
Start: 2021-12-21 — End: ?

## 2021-12-21 MED ORDER — VASOPRESSIN 20 UNITS/20ML SYR (1 UNIT/ML) (AN) (OSM)
INTRAVENOUS | 0 refills | Status: DC
Start: 2021-12-21 — End: 2021-12-21

## 2021-12-21 MED ORDER — EPINEPHRINE 0.1 MG/ML IJ SYRG
0 refills | Status: CP
Start: 2021-12-21 — End: ?

## 2021-12-21 MED ORDER — POTASSIUM CHLORIDE IN WATER 10 MEQ/50 ML IV PGBK
10 meq | INTRAVENOUS | 0 refills | Status: AC | PRN
Start: 2021-12-21 — End: ?
  Administered 2021-12-21 – 2021-12-22 (×10): 10 meq via INTRAVENOUS

## 2021-12-21 MED ORDER — FENTANYL CITRATE (PF) 50 MCG/ML IJ SOLN
25-50 ug | INTRAVENOUS | 0 refills | Status: AC | PRN
Start: 2021-12-21 — End: ?
  Administered 2021-12-22 (×2): 50 ug via INTRAVENOUS
  Administered 2021-12-23: 18:00:00 100 ug via INTRAVENOUS

## 2021-12-21 MED ORDER — CALCIUM GLUC IN NACL, ISO-OSM 1 GRAM/100 ML IV SOLN
1 g | Freq: Once | INTRAVENOUS | 0 refills | Status: CP
Start: 2021-12-21 — End: ?
  Administered 2021-12-21: 14:00:00 1 g via INTRAVENOUS

## 2021-12-21 MED ORDER — PERFLUTREN LIPID MICROSPHERES 1.1 MG/ML IV SUSP
1-10 mL | Freq: Once | INTRAVENOUS | 0 refills | Status: AC | PRN
Start: 2021-12-21 — End: ?

## 2021-12-21 MED ORDER — ALBUMIN, HUMAN 5 % IV SOLP
250 mL | Freq: Once | INTRAVENOUS | 0 refills | Status: CP
Start: 2021-12-21 — End: ?
  Administered 2021-12-21: 250 mL via INTRAVENOUS

## 2021-12-21 MED ORDER — POTASSIUM CHLORIDE IN WATER 10 MEQ/50 ML IV PGBK
10 meq | INTRAVENOUS | 0 refills | Status: CP
Start: 2021-12-21 — End: ?
  Administered 2021-12-21: 10 meq via INTRAVENOUS

## 2021-12-21 MED ORDER — AMIODARONE 200 MG PO TAB
400 mg | Freq: Two times a day (BID) | ORAL | 0 refills | Status: DC
Start: 2021-12-21 — End: 2021-12-21

## 2021-12-21 MED ORDER — ASPIRIN 325 MG PO TAB
325 mg | Freq: Once | NASOGASTRIC | 0 refills | Status: CP
Start: 2021-12-21 — End: ?
  Administered 2021-12-21: 14:00:00 325 mg via NASOGASTRIC

## 2021-12-21 MED ORDER — SODIUM BICARBONATE 1 MEQ/ML (8.4 %) IV SOLN
50 meq | Freq: Once | INTRAVENOUS | 0 refills | Status: CP
Start: 2021-12-21 — End: ?
  Administered 2021-12-21: 15:00:00 50 meq via INTRAVENOUS

## 2021-12-21 MED ORDER — ALBUMIN, HUMAN 5 % IV SOLP
250 mL | Freq: Once | INTRAVENOUS | 0 refills | Status: CP
Start: 2021-12-21 — End: ?
  Administered 2021-12-21: 21:00:00 250 mL via INTRAVENOUS

## 2021-12-21 MED ORDER — AMINOCAPROIC ACID INFUSION
2 g/h | INTRAVENOUS | 0 refills | Status: AC
Start: 2021-12-21 — End: ?

## 2021-12-21 MED ORDER — POTASSIUM CHLORIDE IN WATER 10 MEQ/50 ML IV PGBK
10 meq | INTRAVENOUS | 0 refills | Status: CP
Start: 2021-12-21 — End: ?
  Administered 2021-12-21: 22:00:00 10 meq via INTRAVENOUS

## 2021-12-21 MED ORDER — MAGNESIUM SULFATE IN WATER 4 GRAM/50 ML (8 %) IV PGBK
4 g | Freq: Once | INTRAVENOUS | 0 refills | Status: DC
Start: 2021-12-21 — End: 2021-12-21

## 2021-12-21 MED ORDER — EPINEPHRINE 64MCG/ML IN D5W IV DRIP (QUAD CONC)
0-1 ug/kg/min | INTRAVENOUS | 0 refills | Status: DC
Start: 2021-12-21 — End: 2021-12-21
  Administered 2021-12-21 (×2): .3 ug/kg/min via INTRAVENOUS

## 2021-12-21 MED ORDER — POTASSIUM PHOSPHATE IVPB-CENTRAL
24 MMOL | Freq: Once | INTRAVENOUS | 0 refills | Status: CP
Start: 2021-12-21 — End: ?
  Administered 2021-12-21 (×2): 24 mmol via INTRAVENOUS

## 2021-12-21 MED ORDER — FENTANYL (SUBLIMAZE) BOLUS FOR CONTINUOUS INFUSION
25-50 ug | INTRAVENOUS | 0 refills | Status: DC | PRN
Start: 2021-12-21 — End: 2021-12-21

## 2021-12-21 MED ORDER — EPINEPHRINE 16MCG/ML IN D5W IV DRIP (STD CONC)
0-1 ug/kg/min | INTRAVENOUS | 0 refills | Status: DC
Start: 2021-12-21 — End: 2021-12-21
  Administered 2021-12-21 (×2): 0.5 ug/kg/min via INTRAVENOUS

## 2021-12-21 MED ORDER — PANTOPRAZOLE 40 MG PO TBEC
40 mg | Freq: Every day | ORAL | 0 refills | Status: AC
Start: 2021-12-21 — End: ?

## 2021-12-21 MED ORDER — NICARDIPINE 2.5 MG IN 10 ML NS (AM)(OR)
INTRAVENOUS | 0 refills | Status: DC
Start: 2021-12-21 — End: 2021-12-21

## 2021-12-21 MED ORDER — SENNOSIDES-DOCUSATE SODIUM 8.6-50 MG PO TAB
2 | Freq: Two times a day (BID) | ORAL | 0 refills | Status: AC
Start: 2021-12-21 — End: ?
  Administered 2021-12-22 – 2021-12-23 (×3): 2 via ORAL

## 2021-12-21 MED ORDER — ATORVASTATIN 40 MG PO TAB
80 mg | Freq: Once | NASOGASTRIC | 0 refills | Status: CP
Start: 2021-12-21 — End: ?
  Administered 2021-12-21: 14:00:00 80 mg via NASOGASTRIC

## 2021-12-21 MED ORDER — POTASSIUM CHLORIDE IN WATER 10 MEQ/50 ML IV PGBK
10 meq | INTRAVENOUS | 0 refills | Status: DC
Start: 2021-12-21 — End: 2021-12-21

## 2021-12-21 MED ORDER — PROPOFOL 10 MG/ML IV EMUL
5-70 ug/kg/min | INTRAVENOUS | 0 refills | Status: DC
Start: 2021-12-21 — End: 2021-12-21

## 2021-12-21 MED ORDER — POTASSIUM CHLORIDE 20 MEQ/15 ML PO LIQD
20-40 meq | NASOGASTRIC | 0 refills | Status: AC | PRN
Start: 2021-12-21 — End: ?

## 2021-12-21 MED ORDER — POTASSIUM CHLORIDE IN WATER 10 MEQ/50 ML IV PGBK
10 meq | INTRAVENOUS | 0 refills | Status: CP
Start: 2021-12-21 — End: ?
  Administered 2021-12-21: 21:00:00 10 meq via INTRAVENOUS

## 2021-12-21 MED ORDER — NITROGLYCERIN IN 5 % DEXTROSE 50 MG/250 ML (200 MCG/ML) IV SOLN
.1-3 ug/kg/min | INTRAVENOUS | 0 refills | Status: DC
Start: 2021-12-21 — End: 2021-12-21

## 2021-12-21 MED ORDER — EPINEPHRINE 16MCG/ML IN D5W IV DRIP (STD CONC)
0-.15 ug/kg/min | INTRAVENOUS | 0 refills | Status: DC
Start: 2021-12-21 — End: 2021-12-21

## 2021-12-21 MED ORDER — SODIUM BICARBONATE 1 MEQ/ML (8.4 %) IV SOLN
50 meq | Freq: Once | INTRAVENOUS | 0 refills | Status: CP
Start: 2021-12-21 — End: ?

## 2021-12-21 MED ORDER — MAGNESIUM SULFATE IN WATER 4 GRAM/50 ML (8 %) IV PGBK
4 g | Freq: Once | INTRAVENOUS | 0 refills | Status: AC
Start: 2021-12-21 — End: ?
  Administered 2021-12-22: 02:00:00 4 g via INTRAVENOUS

## 2021-12-21 MED ORDER — EPINEPHRINE 16MCG/ML IN D5W IV DRIP (STD CONC)
0-.15 ug/kg/min | INTRAVENOUS | 0 refills | Status: AC
Start: 2021-12-21 — End: ?

## 2021-12-21 MED ORDER — PANTOPRAZOLE 40 MG IV SOLR
40 mg | Freq: Every day | INTRAVENOUS | 0 refills | Status: DC
Start: 2021-12-21 — End: 2021-12-21

## 2021-12-21 MED ORDER — NOREPINEPHRINE BITARTRATE-NACL 4 MG/250 ML (16 MCG/ML) IV SOLN
0-.5 ug/kg/min | INTRAVENOUS | 0 refills | Status: AC
Start: 2021-12-21 — End: ?
  Administered 2021-12-23: 08:00:00 0.12 ug/kg/min via INTRAVENOUS

## 2021-12-21 MED ORDER — FENTANYL CITRATE (PF) 50 MCG/ML IJ SOLN
25-50 ug | INTRAVENOUS | 0 refills | Status: DC | PRN
Start: 2021-12-21 — End: 2021-12-21

## 2021-12-21 MED ORDER — OXYCODONE 5 MG PO TAB
5-10 mg | ORAL | 0 refills | Status: AC | PRN
Start: 2021-12-21 — End: ?

## 2021-12-21 MED ORDER — AMIODARONE IN DEXTROSE,ISO-OSM 360 MG/200 ML (1.8 MG/ML) IV SOLN
1 mg/min | INTRAVENOUS | 0 refills | Status: AC
Start: 2021-12-21 — End: ?

## 2021-12-21 MED ORDER — ANGIOTENSIN II IV DRIP (STD CONC)
0-40 ng/kg/min | INTRAVENOUS | 0 refills | Status: DC
Start: 2021-12-21 — End: 2021-12-21

## 2021-12-21 MED ORDER — HYDRALAZINE 20 MG/ML IJ SOLN
10 mg | INTRAVENOUS | 0 refills | Status: AC | PRN
Start: 2021-12-21 — End: ?

## 2021-12-21 MED ORDER — ATROPINE 0.1 MG/ML IJ SYRG
.5 mg | Freq: Once | INTRAVENOUS | 0 refills | Status: AC | PRN
Start: 2021-12-21 — End: ?

## 2021-12-21 MED ORDER — VASOPRESSIN 0.2 UNIT/ML IV SOLN
1.8 [IU]/h | INTRAVENOUS | 0 refills | Status: DC
Start: 2021-12-21 — End: 2021-12-21
  Administered 2021-12-21: 19:00:00 3.6 [IU]/h via INTRAVENOUS

## 2021-12-21 MED ORDER — CLOPIDOGREL 75 MG PO TAB
75 mg | Freq: Every day | OROGASTRIC | 0 refills | Status: DC
Start: 2021-12-21 — End: 2021-12-21

## 2021-12-21 MED ORDER — SODIUM CHLORIDE 0.9 % IV SOLP
0 refills | Status: DC
Start: 2021-12-21 — End: 2021-12-21

## 2021-12-21 MED ORDER — POLYETHYLENE GLYCOL 3350 17 GRAM PO PWPK
1 | Freq: Two times a day (BID) | ORAL | 0 refills | Status: AC
Start: 2021-12-21 — End: ?
  Administered 2021-12-22 – 2021-12-23 (×3): 17 g via ORAL

## 2021-12-21 MED ORDER — HEPARIN (PORCINE) IN NACL (PF) 2,000 UNIT/1,000 ML IV SOLP
6 [IU]/h | INTRAVENOUS | 0 refills | Status: AC
Start: 2021-12-21 — End: ?
  Administered 2021-12-21: 21:00:00 6 [IU]/h via INTRAVENOUS

## 2021-12-21 MED ORDER — POTASSIUM CHLORIDE IN WATER 10 MEQ/50 ML IV PGBK
10 meq | INTRAVENOUS | 0 refills | Status: AC
Start: 2021-12-21 — End: ?

## 2021-12-21 MED ORDER — POTASSIUM CHLORIDE IN WATER 10 MEQ/50 ML IV PGBK
10 meq | INTRAVENOUS | 0 refills | Status: CP
Start: 2021-12-21 — End: ?
  Administered 2021-12-22: 01:00:00 10 meq via INTRAVENOUS

## 2021-12-21 MED ORDER — ONDANSETRON 4 MG PO TBDI
4 mg | ORAL | 0 refills | Status: AC | PRN
Start: 2021-12-21 — End: ?

## 2021-12-21 MED ORDER — AMIODARONE 150 MG IN D5W 100 ML IVPB LOAD
150 mg | Freq: Once | INTRAVENOUS | 0 refills | Status: AC
Start: 2021-12-21 — End: ?

## 2021-12-21 MED ORDER — NOREPINEPHRINE BITARTRATE-NACL 4 MG/250 ML (16 MCG/ML) IV SOLN
0-.5 ug/kg/min | INTRAVENOUS | 0 refills | Status: DC
Start: 2021-12-21 — End: 2021-12-21
  Administered 2021-12-21: 13:00:00 0.05 ug/kg/min via INTRAVENOUS

## 2021-12-21 MED ORDER — PANTOPRAZOLE 40 MG IV SOLR
40 mg | Freq: Every day | INTRAVENOUS | 0 refills | Status: AC
Start: 2021-12-21 — End: ?
  Administered 2021-12-22 – 2021-12-23 (×2): 40 mg via INTRAVENOUS

## 2021-12-21 MED ORDER — MAGNESIUM SULFATE IN D5W 1 GRAM/100 ML IV PGBK
1 g | INTRAVENOUS | 0 refills | Status: AC | PRN
Start: 2021-12-21 — End: ?

## 2021-12-21 MED ORDER — PERFLUTREN LIPID MICROSPHERES 1.1 MG/ML IV SUSP
1-10 mL | Freq: Once | INTRAVENOUS | 0 refills | Status: DC | PRN
Start: 2021-12-21 — End: 2021-12-21

## 2021-12-21 MED ORDER — FAMOTIDINE (PF) 20 MG/2 ML IV SOLN
20 mg | Freq: Two times a day (BID) | INTRAVENOUS | 0 refills | Status: DC
Start: 2021-12-21 — End: 2021-12-21

## 2021-12-21 MED ORDER — NOREPINEPHRINE BITARTRATE-NACL 4 MG/250 ML (16 MCG/ML) IV SOLN
0-.5 ug/kg/min | INTRAVENOUS | 0 refills | Status: DC
Start: 2021-12-21 — End: 2021-12-21

## 2021-12-21 MED ORDER — POTASSIUM CHLORIDE 20 MEQ PO TBTQ
20-40 meq | ORAL | 0 refills | Status: AC | PRN
Start: 2021-12-21 — End: ?

## 2021-12-21 MED ORDER — EPINEPHRINE 16MCG/ML IN D5W IV DRIP (STD CONC)
0-1 ug/kg/min | INTRAVENOUS | 0 refills | Status: DC
Start: 2021-12-21 — End: 2021-12-21

## 2021-12-21 MED ORDER — MAGNESIUM HYDROXIDE 400 MG/5 ML PO SUSP
30 mL | Freq: Every day | ORAL | 0 refills | Status: AC | PRN
Start: 2021-12-21 — End: ?
  Administered 2021-12-22: 15:00:00 30 mL via ORAL

## 2021-12-21 MED ORDER — NOREPINEPHRINE 64 MCG/ML IN D5W IV DRIP (QUAD CONC)
0-.5 ug/kg/min | INTRAVENOUS | 0 refills | Status: DC
Start: 2021-12-21 — End: 2021-12-21
  Administered 2021-12-21 (×2): .3 ug/kg/min via INTRAVENOUS

## 2021-12-21 MED ORDER — FENTANYL CITRATE (PF) 50 MCG/ML IJ SOLN
100 ug | Freq: Once | INTRAVENOUS | 0 refills | Status: CP
Start: 2021-12-21 — End: ?
  Administered 2021-12-21: 100 ug via INTRAVENOUS

## 2021-12-21 MED ADMIN — SODIUM BICARBONATE 1 MEQ/ML (8.4 %) IV SOLN [7309]: 50 meq | INTRAVENOUS | @ 14:00:00 | Stop: 2021-12-21 | NDC 00409662522

## 2021-12-21 MED ADMIN — AMIODARONE IN DEXTROSE,ISO-OSM 360 MG/200 ML (1.8 MG/ML) IV SOLN [307603]: 1 mg/min | INTRAVENOUS | @ 14:00:00 | Stop: 2021-12-21 | NDC 43066036020

## 2021-12-21 NOTE — Other
Procedure Note    Lynn Hall is a 56 y.o. female.        Anesthesia Procedure: Arterial Line Placement    A-LINE INSERTION    Date/Time: 12/21/2021 3:28 PM  Patient location: OR  Indications: multiple ABGs, frequent labs and hemodynamic monitoring      Preprocedure checklist performed: 2 patient identifiers, risks & benefits discussed, patient evaluated, timeout performed and patient being monitored    Sterile technique:  - Proper hand washing  - Cap, mask  - Sterile gloves  - Skin prep for antisepsis        Arterial Line Procedure   Patient sedated: no    Artery prepped with chlorhexidine; skin prep agent completely dried prior to procedure.  Location: brachial artery  Laterality: right  Technique: ultrasound  Needle gauge: 4 Fr.  Number of attempts: 1    Procedure Outcome  Catheter secured with adhesive dressing applied and line sutured  Events: no complications noted during insertion and skin intact, warm, and dry    Observation: pt tolerated well    Refer to nursing documentation for vitals and monitoring data during procedure.    Performed by: Devona Konig, MD  Authorized by: Meredith Pel, MD            Devona Konig, MD

## 2021-12-21 NOTE — Progress Notes
Code/Rapid Response Medication Nurse Sign Off      Patient: Lynn Hall    I have reviewed the Code/Rapid Response Timeline Event Report and confirm that I administered the medications listed on this patient during the event on date 12/21/21 at event start time 1000.

## 2021-12-21 NOTE — Anesthesia Post-Procedure Evaluation
Post-Anesthesia Evaluation    Name: Lynn Hall      MRN: [id number]     DOB: February 15, 1965     Age: 56 y.o.     Sex: female   __________________________________________________________________________     Procedure Information     Anesthesia Start Date/Time: 12/21/21 1308    Procedure: Extracorporeal membrane oxygenation revision (Left)    Location: CVOR 5 / CVOR    Providers: Eino Farber, MD          Post-Anesthesia Vitals  ABP: 127/63 (11/14 1425)   Vitals Value Taken Time   BP     Temp 35.4 C 12/21/21 1440   Pulse 89 12/21/21 1437   Respirations 18 PER MINUTE 12/21/21 1437   SpO2 100 % 12/21/21 1436   O2 Device     ABP 127/63 12/21/21 1425   ART BP     Vitals shown include unvalidated device data.      Post Anesthesia Evaluation Note    Evaluation location: ICU  Patient participation: patient intubated, unable to assess; expectation of recovery by ICU physician  Level of consciousness: intubated & sedated    Hydration: normovolemia  Temperature: 36.0C - 38.4C  Airway patency: adequate    Perioperative Events       Post-op nausea and vomiting: no PONV    Postoperative Status  Cardiovascular status: hemodynamically stable  Respiratory status: supplemental oxygen and ETT  ICU Information  VasoactiveDrips:epinephrine  Blood Products Given-yes  PRBC units given: 2          Cardiac ICU information    cardiac output assessed  cardiac index monitored    Staff involved in transport include: anes resident, anesthesiologist and other      Perioperative Events  No notable events documented.

## 2021-12-21 NOTE — Response Teams
Code Blue Team Progress Note    Date: 12/21/2021 Time: 10:26 AM  Patient: Lynn Hall  Attending: Rexanne Mano* Service: Cardiology Service - [id number]  Admission Date: 12/21/2021  LOS: 0 days    A Code/Rapid Response Timeline Event Report has been created for this patient on 12/21/2021 at 0956. Upon CB team arrival, defibrillation completed. CB team assisted in administering sodium bicarbonate and epinephrine. ROSC achieved and procedure ongoing. CB team dismissed.     ETT Confirmation Method: Capnometry (numeric CO2).    The lead provider for this event was Dr Hajj.    Baruch Gouty, RN

## 2021-12-21 NOTE — Progress Notes
~  0700: pt arrived via flight crew. M4 team bedside for CVC and art line placement. Full assessment deferred at this time.       Block Charting    1. Time of block charting period: Start: 0700 Stop: 0900    2. Indication: Rapid resuscitation/emergent phase of care    3. Titration Summary (all infusions are titrated by parameter listed in order):    Drug Name Max Dose given during block charting event   Epinephrine (mcg/kg/min) 1   Norepinephrine (mcg/kg/min) .5                                         4. For starting and ending rates prior to and at the end of block charting event, see dose/rate verify documentation MAR    5. Summary of additional events/interventions (if applicable): M4 team and cardiology team bedside. STEMI activation. Pt to transport to Cath lab via CICU RNs.

## 2021-12-21 NOTE — Other
Brief Operative Note    Name: NORMAGENE HARVIE is a 56 y.o. female     DOB: Feb 26, 1965             MRN#: [id number]  DATE OF OPERATION: 12/21/2021    Date:  12/21/2021        Preoperative Dx:   Cardiac arrest (HCC) [I46.9]    Post-op Diagnosis      * Cardiac arrest (HCC) [I46.9]    Procedure(s) (LRB):  Extracorporeal membrane oxygenation revision (Left)    Surgeon(s) and Role:     * Danter, Lesia Sago, MD - Primary     * Vincent Gros, PA-C - Assisting     * Wynema Birch, MD - Fellow    Surgical site infection present at time of surgery?  No    Findings:  Repair of left femoral artery, left femoral vein with temporary closure, remains on VA ECMO via left groin. Improved hemodynamics and flows, stable abdominal distension at end of case.     Estimated Blood Loss: No blood loss documented.     Specimen(s) Removed/Disposition: * No specimens in log *    Complications:  None      Implants: * No implants in log *    Drains: None    Disposition:  ICU -unstable    Wynema Birch, MD

## 2021-12-21 NOTE — Progress Notes
Cardiothoracic Surgery Critical Care   Progress Note     Lynn Hall  [id number] Date:  12/21/2021  Admission Date: 12/21/2021  LOS: 0 days      Procedure date: 12/21/21  Procedure(s) (LRB):  Extracorporeal membrane oxygenation (VA ECMO left groin)    Active Problems:    HLD (hyperlipidemia)    Cardiac arrest with ventricular fibrillation (HCC)    Acute respiratory failure with hypoxia (HCC)    Lactic acidosis    Cardiogenic shock (HCC)    STEMI (ST elevation myocardial infarction) (HCC)    Acute encephalopathy    Essential hypertension    Pulmonary edema    CAD S/P percutaneous coronary angioplasty    Hypokalemia    On mechanically assisted ventilation (HCC)    Vasogenic shock (HCC)    Acute blood loss anemia    Shock liver    On VA ECMO    Stress hyperglycemia    Elevated CK    On intra-aortic balloon pump assist      Assessment/Plan:      Neuro - S/p cardiac arrest this am 11/14. Concerning neuro exam on arrival to  (pupils large/minimally responsive, no cough/gag). Continue to hold all sedating medications. CT head 11/14 AM unremarkable. Repeat head CT 11/14 PM shows new SDH. Consult neuro surgery. Start keppra for seizure prevention. Obtain 24 EEG.     CV - Vfib arrest this am. Now SR rate 80-90's. Continue amio gtt 1 mg/min. Lidocaine gtt stopped in OR. MAP goal > 70. Continue epinephrine and vasopressin for vasogenic shock. Continue VA ECMO & IABP for cardiogenic shock. S/p PCI to RCA x2 today 12/21/21. Continue heparin gtt and ASA. Consider addition of plavix (and stopping ASA) after CT to evaluate for RP bleed. Holding statin in setting of shock liver.     Resp - Continue P/AC vent settings. Continue to titrate FDO2 & sweep per ABG. CXR shows diffuse bilateral opacities. Notable pulmonary edema from ETT. Will diurese when able, limited by chugging on ECMO circuit.     Renal - Baseline Scr unknown. Monitor UOP. PRN diuresis when able - pt is grossly volume overloaded. Maintain foley catheter for accurate I/O.     GI - OG to LIMWS. Continue post-op bowel regimen. Continue PPI for GI prophylaxis.     ID - Temp regulated on ECMO. Monitor leukocytosis. No current abx. Cx's and RVP sent on admission - follow.     Heme - MTP x2 intra-op for femoral bleeding. Continue heparin gtt for PTT goal 47-69 for ECMO AC. Difficult situation in setting of new SDH & recent PCI today 11/13.      FEN - If creatinine < 2.0, replace Mg and K to minimize cardiac arrhythmias. Hgb A1c 6.2%. Continue insulin gtt per Modified [facility] Protocol.    Activity - flat while on ECMO, q2 hr turn as able    Prophylaxis Review:  Lines:  Yes; Arterial Line; Indication:  Frequent blood draws and Continuous BP monitoring; Location:  Radial  Central Line; Indication:  Frequent blood draws, Med not deliverable peripherally and Hemodynamic monitoring; Type:  Internal jugular  Antibiotic Usage:  No  VTE:  Mechanical prophylaxis; Sequential compression device  Urinary Catheter: Yes; Retain foley due to:  Need for accurate Intake and Output  Temporary Pacing Wires Present?: No Date Removed:     Disposition: Plan as above, continue ICU care.     I have seen, personally fully evaluated, and discussed patient with critical care attending Dr. Jonny Ruiz  [place] and the cardiothoracic surgeon. The patient is critically ill, I spent 110 minutes (excluding time spent performing procedures) providing and personally directing critical care services including direct OR recovery, ventilator management, hemodynamic monitoring and management, lab and radiology review, medication review and management, fluid and electrolyte management and coordination of care.     Gertie Fey, APRN-NP   CTS Intensive Care  Voalte me or Pager [phone]  12/21/2021    Subjective:       HPI:   Lynn Hall is a 56 y.o. female whose current medical conditions include depression, cervical cancer, HTN, HLD. Pts husband reports pt developed chest/shoulder pain approximately 1 week ago -> evaluated & received EKG and CXR which were unremarkable. She was prescribed flexeril and told to take it easy. Unfortunately suffered out of hospital cardiac arrest this am 12/21/21. Unclear if this was witnessed or unwitnessed or how long pt was actually down. EMS called, pt found to be in VF, achieved ROSC. Pt transferred to [facility]. EKG changes after arrival. Pt proceeded to cath lab, PCI x2 to RCA. Became unstable, CTS consulted, cannulated for VA ECMO. Proceeded to CTSICU follow procedure.       REVIEW OF SYSTEMS:   Review of systems not obtained from patient due to patient factors.      Objective:        Medications:  Scheduled Meds:acetaminophen (TYLENOL EXTRA STRENGTH) tablet 1,000 mg, 1,000 mg, Oral, Q6H while awake  [START ON 12/22/2021] aspirin rectal suppository 150 mg, 150 mg, Rectal, QDAY  chlorhexidine gluconate (PERIDEX) 0.12 % solution 15 mL, 15 mL, SEE ADMIN INSTRUCTIONS, BID(8-20)  FENTANYL CITRATE (PF) 50 MCG/ML IJ SOLN (Cabinet Override), , , NOW  levETIRAcetam (KEPPRA) IV push 750 mg, 750 mg, Intravenous, BID  pantoprazole (PROTONIX) injection 40 mg, 40 mg, Intravenous, QDAY(21)   Or  pantoprazole DR (PROTONIX) tablet 40 mg, 40 mg, Oral, QDAY(21)  [START ON 12/22/2021] polyethylene glycol 3350 (MIRALAX) packet 17 g, 1 packet, Oral, BID  potassium chloride in water IVPB 10 mEq, 10 mEq, Intravenous, Q30 MIN   Followed by  potassium chloride in water IVPB 10 mEq, 10 mEq, Intravenous, Q30 MIN   Followed by  potassium chloride in water IVPB 10 mEq, 10 mEq, Intravenous, Q30 MIN  potassium phosphate 24 mmol in dextrose 5% (D5W) 250 mL IVPB (CENTRAL LINE ONLY), 24 mmol, Intravenous, ONCE  [START ON 12/22/2021] sennosides-docusate sodium (SENOKOT-S) tablet 2 tablet, 2 tablet, Oral, BID    Continuous Infusions:  ? aminocaproic acid (AMICAR) 12.5 g in sodium chloride 0.9% (NS) IV infusion     ? amiodarone (CORDARONE) 360 mg/200 mL D5W IV drip (std conc) (premade) 1 mg/min (12/21/21 1509)   ? [Held by Provider] dexMEDEtomidine (PRECEDEX) 400 mcg/NS 100 ml IV drip (premade)     ? EPINEPHrine (ADRENALIN) 4 mg in dextrose 5% (D5W) 250 mL IV drip (std conc) 0.04 mcg/kg/min (12/21/21 1800)   ? heparin (porcine) 20,000 units/D5W 500 mL infusion (std conc)(premade) 500 Units/hr (12/21/21 1534)   ? heparin 2,000 units/NS 1000 mL infusion (premade) 6 Units/hr (12/21/21 1506)   ? insulin regular 100 units/NS 100 mL IV drip (premade) 5 Units/hr (12/21/21 1738)   ? norepinephrine (LEVOPHED) 4 mg/250 mL NS IV drip (std conc)(premade) Stopped (12/21/21 1730)   ? sodium chloride 0.9 %   infusion 30 mL/hr at 12/21/21 1519   ? vasopressin (VASOSTRICT) 20 units in 100 mL IV infusion (std conc)(premade)       PRN and Respiratory Meds:[START ON  12/26/2021] acetaminophen Q6H PRN **OR** [START ON 12/26/2021] acetaminophen Q6H PRN, alum-mag hydroxide-simeth Q4H PRN, atropine Once PRN, bisacodyL QDAY PRN, dextrose 50% (D50) IV PRN, hydrALAZINE Q6H PRN, lidocaine PF PRN, magnesium sulfate PRN **OR** magnesium oxide PRN, milk of magnesium QDAY PRN, nalOXone PRN, ondansetron Q6H PRN **OR** ondansetron (ZOFRAN) IV Q6H PRN, oxyCODONE Q4H PRN, perflutren lipid microspheres Once PRN, [Held by Provider] potassium chloride PRN **OR** [Held by Provider] potassium chloride PRN **OR** [Held by Provider] potassium chloride in water PRN                       Vital Signs: Last Filed                  Vital Signs: 24 Hour Range   BP: 107/79 (11/14 0800)  Temp: 36.3 ?C (97.3 ?F) (11/14 1630)  Pulse: 91 (11/14 1756)  Respirations: 20 PER MINUTE (11/14 1756)  SpO2: 100 % (11/14 1756)  O2%: 100 % (11/14 1756) BP: (107)/(79)   ABP: (72-123)/(53-74)   ART MAP (Calculated) mm Hg:  [65 mm Hg-88 mm Hg]   Temp:  [34.3 ?C (93.8 ?F)-36.3 ?C (97.3 ?F)]   Pulse:  [84-126]   Respirations:  [16 PER MINUTE-27 PER MINUTE]   SpO2:  [86 %-100 %]   O2%:  [40 %-100 %]      Vitals:    12/21/21 0742 12/21/21 0829   Weight: 70 kg (154 lb 5.2 oz) 70.5 kg (155 lb 6.8 oz) Intake/Output Summary:  (Last 24 hours)    Intake/Output Summary (Last 24 hours) at 12/21/2021 1817  Last data filed at 12/21/2021 1600  Gross per 24 hour   Intake 5888.67 ml   Output 1385 ml   Net 4503.67 ml         Physical Exam:         Neuro: pupils large, minimally reactive  Cardiovascular: RRR  Respiratory: ronchi bta  GI: soft, NT, hypoactive BS, OG with bilious output  Extremities: No Edema  Incisions: Sternal incision dressing, dry and intact. No crepitus or sternal instability.    Artificial airway:  Endotracheal Tube                                                                                         Vent weaning trial:  Off protocol      Laboratory:  LABS:  Recent Labs     12/21/21  0723 12/21/21  1202 12/21/21  1451 12/21/21  1604   NA 145 158* 156* 152*   K 3.7 2.9* 2.3* 2.6*   CL 105 107 109 109   CO2 22 17* 23 23   GAP 18* 34* 24* 20*   BUN 20 22 24 23    CR 0.95 1.20* 1.24* 1.12*   GLU 401* 340* 353* 334*   CA 7.2* 10.5 9.0 8.6   ALBUMIN 2.8* 1.8* 2.4* 3.0*   MG 2.1  --  2.1 1.8   PO4 8.0*  --  1.9* 1.5*   HGBA1C 6.2*  --   --   --    TSH 2.65  --   --   --  Recent Labs     12/21/21  0723 12/21/21  1202 12/21/21  1451 12/21/21  1604   WBC 31.5* 9.2 8.8 9.9   HGB 14.2 9.2* 10.4* 10.0*   HCT 43.6 27.7* 31.4* 29.7*   PLTCT 362 95* 99* 95*   PT 17.7*  --  19.9* 20.5*   INR 1.6*  --  1.8* 1.9*   PTT  --   --  94.0* 85.1*   AST 479* 277* 532* 579*   ALT 360* 205* 280* 285*   ALKPHOS 175* 48 54 53      Estimated Creatinine Clearance: 52.2 mL/min (A) (based on SCr of 1.12 mg/dL (H)).  Vitals:    12/21/21 0742 12/21/21 0829   Weight: 70 kg (154 lb 5.2 oz) 70.5 kg (155 lb 6.8 oz)      Recent Labs     12/21/21  1202 12/21/21  1605   PHART 7.45 7.37   PO2ART 384* 319*           Lab Results   Component Value Date/Time    HGBA1C 6.2 (H) 12/21/2021 07:23 AM    HGBA1C 5.9 08/14/2020 12:00 AM       Radiology and Other Diagnostic Procedures Review:    Reviewed

## 2021-12-21 NOTE — Case Management (ED)
Case Management Progress Note    NAME:Lynn Hall                          MRN: [id number]              DOB:[birth year]          AGE: 56 y.o.  ADMISSION DATE: 12/21/2021             DAYS ADMITTED: LOS: 0 days      Today's Date: 12/21/2021    PLAN: Assessment    SWCM attempted assessment.  Pt off unit and no family in the room.    Chart does not have any emergency contacts listed at this time.    Will con't to follow for assessment.          [place], [place]    [phone] - Amie Critchley

## 2021-12-21 NOTE — Progress Notes
1751    Per son, Sakina standing at work [employer], witnessed fall to the ground, not breathing. EMS called. EMS - V fib shocked 6 times 3 doses epi in field. ROS in ED with HR 70s SR b/p 60/palp Agonal breathing pupils fixed 2 mm. Patient intubated, Levophed 12 mcg, epi get 15 mcg and amiodarone 15 mg HR ST 99 b/p 119/80. ETT 7.0 tube 22 cm at teeth - placement confirmed by x-ray. Bagging now working on getting vent set up.     Head to toe assessment not signs of trauma. Per husband patient was seen in clinic Wed or Thursday this week for chest pain dx with muscle strain given Flexeril.     Labs WBC 16 K 2.7 AST and ALT elevated - rest not back yet.     PMH depression cervical cancer HTN HLD         No apparent head injury, Pupils 2 mm sluggish, lungs course.

## 2021-12-22 ENCOUNTER — Encounter: Admit: 2021-12-22 | Discharge: 2021-12-22

## 2021-12-22 ENCOUNTER — Inpatient Hospital Stay: Admit: 2021-12-22 | Discharge: 2021-12-22

## 2021-12-22 MED ADMIN — DEXTROSE 5% IN WATER IV SOLP [2364]: 5 mg/h | INTRAVENOUS | @ 16:00:00 | NDC 00338001738

## 2021-12-22 MED ADMIN — SODIUM CHLORIDE 0.9 % IV PGBK (MB+) [95161]: 4.5 g | INTRAVENOUS | @ 18:00:00 | NDC 00338915930

## 2021-12-22 MED ADMIN — SODIUM CHLORIDE 0.9 % IV SOLP [27838]: 1750 mg | INTRAVENOUS | @ 13:00:00 | Stop: 2021-12-22 | NDC 00338004902

## 2021-12-22 MED ADMIN — VANCOMYCIN 5 GRAM IV SOLR [8444]: 1750 mg | INTRAVENOUS | @ 13:00:00 | Stop: 2021-12-22 | NDC 00409650901

## 2021-12-22 MED ADMIN — SODIUM CHLORIDE 0.9 % IV PGBK (MB+) [95161]: 4.5 g | INTRAVENOUS | @ 23:00:00 | NDC 00338915930

## 2021-12-22 MED ADMIN — FUROSEMIDE 10 MG/ML IJ SOLN [3291]: 5 mg/h | INTRAVENOUS | @ 16:00:00 | NDC 55150032401

## 2021-12-22 MED ADMIN — PROPOFOL 10 MG/ML IV EMUL [11150]: 10 ug/kg/min | INTRAVENOUS | @ 13:00:00 | NDC 00069024801

## 2021-12-22 MED ADMIN — FENTANYL CITRATE (PF) 50 MCG/ML IJ SOLN [3037]: 25 ug | INTRAVENOUS | @ 10:00:00 | Stop: 2021-12-22 | NDC 00409909412

## 2021-12-22 MED ADMIN — FUROSEMIDE 10 MG/ML IJ SOLN [3291]: 20 mg | INTRAVENOUS | @ 09:00:00 | Stop: 2021-12-22 | NDC 63323028001

## 2021-12-22 MED ADMIN — PIPERACILLIN-TAZOBACTAM 4.5 GRAM IV SOLR [80419]: 4.5 g | INTRAVENOUS | @ 12:00:00 | NDC 60505615900

## 2021-12-22 MED ADMIN — PIPERACILLIN-TAZOBACTAM 4.5 GRAM IV SOLR [80419]: 4.5 g | INTRAVENOUS | @ 18:00:00 | NDC 60505615900

## 2021-12-22 MED ADMIN — PIPERACILLIN-TAZOBACTAM 4.5 GRAM IV SOLR [80419]: 4.5 g | INTRAVENOUS | @ 23:00:00 | NDC 60505615900

## 2021-12-22 MED ADMIN — ALBUMIN, HUMAN 5 % IV SOLP [8982]: 250 mL | INTRAVENOUS | @ 10:00:00 | Stop: 2021-12-22 | NDC 68516521401

## 2021-12-22 MED ADMIN — SODIUM CHLORIDE 0.9 % IV PGBK (MB+) [95161]: 4.5 g | INTRAVENOUS | @ 12:00:00 | NDC 00338915930

## 2021-12-22 NOTE — Care Coordination-Inpatient
Brief vEEG hookup note:    Reviewed from 19:05 to 20:05. Moderate diffuse background slowing noted. There are frequent bursts of generalized delta activity throughout recording without evolution. Focal epileptiform discharges can be seen at T8/P8 with periodicity. No clinical or electrographic seizures are seen. Full report to follow in am.     Lynn Hall [facility]  Department of Neurology

## 2021-12-22 NOTE — Progress Notes
I saw this patient today upon admission to the CTICU. I have reviewed all pertinent data, including labs, operative reports, radiology, vital signs, neurologic assessments, nursing notes, respiratory therapy data, intake and output, and consult notes and have devised a plan based on the aforementioned data. This patient is critically ill and is at risk for life threatening deterioration necessitating complex medical decision making and ongoing provision of ICU care for close monitoring.      Critical Care Time: 230 minutes    Brief Patient Summary: Ms. Lemaire is a 56 year old female with medical problem list of depression and cervical cancer who had an unwitnessed cardiac arrest at work with unknown downtime, prolonged resuscitation [reported anywhere from 1-2 hours], eventual ROSC. Intubated at OSH and transferred to [facility] for further care. Upon arrival, Cardiology consulted and EKG notable for ST depression in V3-V6 and slight depression in III. TTE with wall motion abnormalities. Cath lab activated, brought for [facility]. Found to have completely occluded RCA, received DES x 2 and began to have ventricular arrhythmias and high pressor requirements. She received multiple shocks, Ernestine Conrad was placed and had markedly low CO/CI. An IABP was placed the decision was made to place the patient on ECMO. She was peripherally cannulated onto VA ECMO with [facility], left femoral arterial and venous injuries were noted and she was transferred to the OR for repair. After successful repair, she was admitted to the ICU for further care.     Assessment & Plan:  Neuro: Neuro exam slightly improved over last few hours. Pupils minimally reactive. Some withdrawal to painful stimuli. Cranial reflexes returning. CT with new subdural hematoma. Neurosurgery following, recs appreciated. Plan for stability scan tomorrow morning. Keppra for seizure prophylaxis. EEG in place, appreciate Epilepsy assistance. Guarded neurologic prognosis but still much too early to prognosticate and slight improvement noted thus far. Sedate for ventilator synchrony,   CV: s/p cardiac arrest due to acute RCA STEMI s/p DES x 2 to RCA. Placed onto [facility] ECMO due to refractory ventricular arrhythmias, cardiogenic shock, high pressor and inotrope requirements. Pressors weaned dramatically after VA ECMO and resuscitation. [facility] for leg perfusion, good pulses. Heparin drip for ECMO. Hold on Plavix for now due to new SDH. If stable on repeat CTH should probably start some Plavix due to fresh stents after STEMI. Not pulsatile as of yet under IABP but expect some to return after myocardial stunning improves. Amiodarone for arrhythmias.   Pulmonary: White out of bilateral lungs, heavy pulmonary edema. Continue mechanical ventilation, monitor for differential hypoxia. Diuresis as tolerated. Consider additional ECMO limb if increased cardiac pulsatility becomes an issue.   Renal: Creatinine remarkably stable. Diuresis as tolerated.   ID: Monitor for signs of infection.   GI: NPO. Start enteral nutrition tomorrow if abdominal exam reassuring and pressor requirements down.   Heme: Heparin drip for ECMO  Endocrine: Insulin drip, goal BG 140-180    PPx:  - DVT: heparin drip  - GI: PPI  - VAP: CHX  - Bowel Regimen: senna/docusate, miralax  - Glucose Control: Insulin drip  - IVF: PRN  - Electrolytes: Goal K > 4, Mg > 2  - Nutrition: NPO  - LDA: R IJ Triple Lumen, L IJ Introducer with PA-Catheter, ET tube, OG tube, PIVs, L radial arterial line, R brachial arterial line, R femoral arterial sheath with IABP, L femoral ECMO arterial return cannula, L femoral venous ECMO drainage cannula

## 2021-12-22 NOTE — Consults
Neurosurgery Consult History and Physical Note      Admission Date: 12/21/2021                                                LOS: 0 days    Reason for Consult:  new SDH; on VA ecmo, anticoagulated    Consult type: Co-Management w/Signed Orders    Consulting Physician: Neurosurgery Attendings: Janyce Llanos, MD    Requesting Physician: Eino Farber, MD    Assessment/Plan:  Lynn Hall is a 56 y.o. female s/p cardiac arrest, PCI x2 RCA and ECMO. Neurosurgery was consulted because [facility] demonstrates small R cerebral and parafalcine SDH in the setting of anticoagulation. On exam patient flicker WD in BLLE, flaccid in uppers, +cough, pupils minimally reactive, - corneals.     - No acute neurosurgical intervention   - Repeat CTH in AM when able from a cardiac standpoint  - Discussed risk benefit of anticoagulation in the setting of ECMO and post cardiac stents.    > At this time patient is not a neurosurgical candidate if SDH was to increase in size d/t ECMO, anticoagulation status and poor neurological exam.    > Currently per Cardiac ICU team it is not reasonable to d/c anticoagulation or antiplatelets.   - Q1H NC   - Keppra 500 BID is reasonable   - vEEG per primary team   - Rest of care per primary team     - Patient seen and plan discussed with attending Dr. Ernesta Amble, MD   Neurological Surgery    Please page [phone] with questions.  _____________________________________________________________________    History of Present Illness:   Lynn Hall is a 56 y.o. female whose current medical conditions include depression, cervical cancer, HTN, HLD. Pts husband reports pt developed chest/shoulder pain approximately 1 week ago -> evaluated & received EKG and CXR which were unremarkable. She was prescribed flexeril and told to take it easy. Unfortunately suffered out of hospital cardiac arrest this am 12/21/21. Unclear if this was witnessed or unwitnessed or how long pt was actually down. EMS called, pt found to be in VF, achieved ROSC. Pt transferred to [facility]. EKG changes after arrival. Pt proceeded to cath lab, PCI x2 to RCA. Became unstable, CTS consulted, cannulated for VA ECMO. Proceeded to CTSICU follow procedure.     Post cardiac interventions CTH was obtained and demonstrated R SDH.     Past Medical History:  Medical History:   Diagnosis Date   ? Essential hypertension 12/21/2021   ? Family history of coronary artery disease 08/27/2020   ? Gallbladder polyp    ? HLD (hyperlipidemia) 08/26/2020       Past Surgical History:  Surgical History:   Procedure Laterality Date   ? COLONOSCOPY         Social History:  Social History     Tobacco Use   ? Smoking status: Never   ? Smokeless tobacco: Never   Substance Use Topics   ? Alcohol use: No   ? Drug use: No       Family History:  Family History   Problem Relation Age of Onset   ? Cancer Maternal Grandmother         STOMACH   ? Cancer Maternal Grandfather  BACK       Allergies:    Codeine    Medications:  PTA:  Current Outpatient Medications   Medication Instructions   ? albuterol sulfate (PROAIR HFA) 90 mcg/actuation HFA aerosol inhaler 2 puffs, Inhalation, EVERY  6 HOURS PRN, Shake well before use.   ? ALPRAZolam (XANAX) 0.25 mg, Oral, DAILY  PRN   ? ibuprofen (ADVIL) 200 mg, Oral, EVERY  6 HOURS PRN   ? ondansetron (ZOFRAN ODT) 8 mg rapid dissolve tablet AS NEEDED   ? pantoprazole DR (PROTONIX) 40 mg, Oral, DAILY   ? pregabalin (LYRICA) 50 mg, Oral, TWICE DAILY   ? rosuvastatin (CRESTOR) 20 mg, Oral, DAILY        Inpatient:  Scheduled Meds:acetaminophen (TYLENOL EXTRA STRENGTH) tablet 1,000 mg, 1,000 mg, Oral, Q6H while awake  amiodarone (CORDARONE) 150 mg in dextrose 5% (D5W) 100 mL IVPB (load), 150 mg, Intravenous, ONCE  [START ON 12/22/2021] aspirin rectal suppository 150 mg, 150 mg, Rectal, QDAY  chlorhexidine gluconate (PERIDEX) 0.12 % solution 15 mL, 15 mL, SEE ADMIN INSTRUCTIONS, BID(8-20)  FENTANYL CITRATE (PF) 50 MCG/ML IJ SOLN (Cabinet Override), , , NOW  levETIRAcetam (KEPPRA) IV push 750 mg, 750 mg, Intravenous, BID  pantoprazole (PROTONIX) injection 40 mg, 40 mg, Intravenous, QDAY(21)   Or  pantoprazole DR (PROTONIX) tablet 40 mg, 40 mg, Oral, QDAY(21)  [START ON 12/22/2021] polyethylene glycol 3350 (MIRALAX) packet 17 g, 1 packet, Oral, BID  potassium chloride in water IVPB 10 mEq, 10 mEq, Intravenous, Q30 MIN  potassium phosphate 24 mmol in dextrose 5% (D5W) 250 mL IVPB (CENTRAL LINE ONLY), 24 mmol, Intravenous, ONCE  [START ON 12/22/2021] sennosides-docusate sodium (SENOKOT-S) tablet 2 tablet, 2 tablet, Oral, BID    Continuous Infusions:  ? aminocaproic acid (AMICAR) 12.5 g in sodium chloride 0.9% (NS) IV infusion     ? amiodarone (CORDARONE) 360 mg/200 mL D5W IV drip (std conc) (premade) 1 mg/min (12/21/21 1509)   ? [Held by Provider] dexMEDEtomidine (PRECEDEX) 400 mcg/NS 100 ml IV drip (premade)     ? EPINEPHrine (ADRENALIN) 4 mg in dextrose 5% (D5W) 250 mL IV drip (std conc) 0.04 mcg/kg/min (12/21/21 1800)   ? heparin (porcine) 20,000 units/D5W 500 mL infusion (std conc)(premade) 500 Units/hr (12/21/21 1924)   ? heparin 2,000 units/NS 1000 mL infusion (premade) 6 Units/hr (12/21/21 1924)   ? insulin regular 100 units/NS 100 mL IV drip (premade) 7 Units/hr (12/21/21 1923)   ? norepinephrine (LEVOPHED) 4 mg/250 mL NS IV drip (std conc)(premade) 0.02 mcg/kg/min (12/21/21 1810)   ? sodium chloride 0.9 %   infusion 30 mL/hr at 12/21/21 1519   ? vasopressin (VASOSTRICT) 20 units in 100 mL IV infusion (std conc)(premade)       PRN and Respiratory Meds:[START ON 12/26/2021] acetaminophen Q6H PRN **OR** [START ON 12/26/2021] acetaminophen Q6H PRN, alum-mag hydroxide-simeth Q4H PRN, atropine Once PRN, bisacodyL QDAY PRN, dextrose 50% (D50) IV PRN, fentaNYL citrate PF Q1H PRN, hydrALAZINE Q6H PRN, lidocaine PF PRN, magnesium sulfate PRN **OR** magnesium oxide PRN, milk of magnesium QDAY PRN, nalOXone PRN, ondansetron Q6H PRN **OR** ondansetron (ZOFRAN) IV Q6H PRN, oxyCODONE Q4H PRN, perflutren lipid microspheres Once PRN, [Held by Provider] potassium chloride PRN **OR** [Held by Provider] potassium chloride PRN **OR** [Held by Provider] potassium chloride in water PRN      Review of Systems:  Full 10 point review of systems unable to be collected     Physical Exam:  Vital Signs:  Last Filed in 24 hours Vital Signs:  24 hour Range    BP: 107/79 (11/14 0800)  Temp: 36.3 ?C (97.3 ?F) (11/14 1900)  Pulse: 89 (11/14 1900)  Respirations: 28 PER MINUTE (11/14 1900)  SpO2: 100 % (11/14 1900)  O2%: 100 % (11/14 1900) BP: (107)/(79)   ABP: (72-123)/(50-74)   ART MAP (Calculated) mm Hg:  [65 mm Hg-88 mm Hg]   Temp:  [34.3 ?C (93.8 ?F)-36.3 ?C (97.3 ?F)]   Pulse:  [83-126]   Respirations:  [16 PER MINUTE-28 PER MINUTE]   SpO2:  [86 %-100 %]   O2%:  [40 %-100 %]      General appearance: No acute distress      Neurologic Exam:   Mental Status: Intubated, obtunded   Pupils: Pupils equal round and reactive to light on pupillometer  Cranial Nerves: No corneal BL, + cough   Motor: Flicker in BLLE, flaccid in BLUE    LabTests:  Hematology:    Lab Results   Component Value Date    HGB 10.0 12/21/2021    HCT 29.7 12/21/2021    PLTCT 95 12/21/2021    WBC 9.9 12/21/2021    NEUT 84 12/21/2021    ANC 7.68 12/21/2021    ALC 1.38 12/21/2021    MONA 1 12/21/2021    AMC 0.10 12/21/2021    EOSA 0 12/21/2021    ABC 0.02 12/21/2021    MCV 87.5 12/21/2021    MCH 29.6 12/21/2021    MCHC 33.8 12/21/2021    MPV 7.6 12/21/2021    RDW 14.5 12/21/2021     General Chemistry:   Lab Results   Component Value Date    NA 152 12/21/2021    K 2.6 12/21/2021    CL 109 12/21/2021    CO2 23 12/21/2021    BUN 23 12/21/2021    CR 1.12 12/21/2021    GLU 334 12/21/2021    OBSCA 1.12 12/21/2021    CA 8.6 12/21/2021    MG 1.8 12/21/2021    PO4 1.5 12/21/2021     General Chemistry:   Lab Results   Component Value Date    GAP 20 12/21/2021    ALBUMIN 3.0 12/21/2021    LACTIC 11.0 12/21/2021    TOTBILI 1.3 12/21/2021    DBILI 0.19 01/04/2021    TOTPROT 4.4 12/21/2021    AST 579 12/21/2021    ALT 285 12/21/2021    ALKPHOS 53 12/21/2021    LDH 970 12/21/2021       Radiology and other Diagnostics Review:    Pertinent radiology reviewed    Varney Daily, MD  12/21/2021  Neurological Surgery, PGY-2

## 2021-12-22 NOTE — Progress Notes
CTS Nursing Progress Note  ?  Neuro status is Oriented X4: No, GCS 3. EEG ordered, in progress. Pt transported to CT, and returned. Pupils are sluggish, but reactive - see DOC FLOW for pupillometer recordings.     Gtts:  Epinephrine  Norepinephrine  Amiodarone  Heparin  Insulin  NS (carrier)    Estimated average urine output last 4 hours (ml/hr): about per hr     12/21/21 1800   ECLS Settings   Flow (L/min) 3.53 LPM   RPM 2436 RPM   Pre-oxygenator pressure (P2/Ppre) 264 mmHg   Post-oxygenator pressure (P1/Ppost) 242 mmHg   Delta P 22 mmHg   ECLS SvO2 62 %   ECLS SaO2 100 %   ECLS HgB 12.5   ECLS Hct 37   Sweep Gas (L/min) 4 LPM   FDO2 100 %   ECLS Assessment   Pulse Pressure (VA only) Present   Cannulas Air absent;Clot absent;Fibrin absent   Pump Air absent;Clot absent;Fibrin absent   Pre-Oxygenator Clot Absent;Fibrin Absent   Post-Oxygenator Clot Absent;Fibrin Absent   ECLS Interventions P1 and P2 lines flushed   Egress Drainage Color None   Egress Drainage Amount None   Heater Water Temperature 36.5 ?C (97.7 ?F)   Heater Temperature Set Point 36.5 ?C (97.7 ?F)   Heater Pump Status On   Heater Water Level Adequate   Safety Check Backup circuit available;6 clamps available   ECLS Lines   Other Lines ECLS - Arterial Catheter;ECLS - Venous Catheter   ECLS Cannula Venous Drainage Femoral, left   Placement Date/Time: 12/21/21 1052   Type: Venous  Return/Drainage: Drainage  Location: Femoral, left  Cannula Size: 21 FR   ECLS Cannula WDL WDL   Dressing Change Due 16/10/96   ECLS Cannula Arterial Femoral, left   Placement Date/Time: 12/21/21 1200   Type: Arterial  Location: Femoral, left  Cannula Size: 17 FR   ECLS Cannula WDL WDL   Dressing Change Due 04/54/09   Neurovascular Check LUE   Neurovascular LUE WDL X   LUE Radial Pulse 2+   LUE Temperature Cool   LUE Color Pale   Neurovascular Check RUE   Neurovascular RUE WDL X   RUE Radial Pulse 2+   RUE Temperature Cool   RUE Color Pale   Neurovascular Check LLE Neurovascular LLE WDL X   LLE Dorsalis Pedis Pulse 2+   LLE Temperature Cool   LLE Color Dusky Ecchymosis   Neurovascular Check RLE   Neurovascular RLE WDL X   RLE Dorsalis Pedis Pulse 2+   RLE Temperature Cool   RLE Color Dusky Ecchymosis      12/21/21 1800   IABP   Heart Rate 84   Assisted Systole/Diastole 76/63   Augmented Diastole 99   Assisted MAP 78 mm Hg   Plateau Pressure 127   IABP Wave Form Dampened   IABP Settings   Assist Ratio 1:1   MAP Alarm Setting 65   Balloon Volume (cc) 30 cc   IABP Assessment   Level Of Consciousness Unresponsive   IABP Site, Tubing, And Dressing Dry/intact;Tubing clear   IABP Dressing Due Date 12/19/21   Left Upper Extremity Pulses   LUE Radial Pulse 2+   Neurovascular Check LUE   Neurovascular LUE WDL X   LUE Temperature Cool   LUE Color Pale   Neurovascular Check RUE   Neurovascular RUE WDL X   RUE Radial Pulse 2+   RUE Temperature Cool   RUE Color Pale  Neurovascular Check LLE   Neurovascular LLE WDL X   LLE Dorsalis Pedis Pulse 2+   LLE Temperature Cool   LLE Color Dusky Ecchymosis   Neurovascular Check RLE   Neurovascular RLE WDL X   RLE Dorsalis Pedis Pulse 2+   RLE Temperature Cool   RLE Color Dusky Ecchymosis

## 2021-12-22 NOTE — Progress Notes
CT compatible EEG Electrodes have been applied to the patient's scalp for the purposes of Video EEG Monitoring___ by LL/HL     Video, sound and EEG tracings are all functioning properly.    The quality and integrity of the electrode connection has been checked and is within the designated range of below 5,000 Ohms.

## 2021-12-23 ENCOUNTER — Inpatient Hospital Stay: Admit: 2021-12-23 | Discharge: 2021-12-23

## 2021-12-23 ENCOUNTER — Encounter: Admit: 2021-12-23 | Discharge: 2021-12-23

## 2021-12-23 DIAGNOSIS — I10 Essential (primary) hypertension: Secondary | ICD-10-CM

## 2021-12-23 DIAGNOSIS — K824 Cholesterolosis of gallbladder: Secondary | ICD-10-CM

## 2021-12-23 DIAGNOSIS — Z8249 Family history of ischemic heart disease and other diseases of the circulatory system: Secondary | ICD-10-CM

## 2021-12-23 DIAGNOSIS — E785 Hyperlipidemia, unspecified: Secondary | ICD-10-CM

## 2021-12-23 MED ADMIN — LORAZEPAM 2 MG/ML IJ SOLN GROUP [280020]: 2 mg | INTRAVENOUS | @ 18:00:00 | Stop: 2021-12-23 | NDC 00409198503

## 2021-12-23 MED ADMIN — SODIUM CHLORIDE 0.9 % IV PGBK (MB+) [95161]: 4.5 g | INTRAVENOUS | @ 05:00:00 | NDC 00338915930

## 2021-12-23 MED ADMIN — HYDROCORTISONE SOD SUCC (PF) 100 MG/2 ML IJ SOLR [302326]: 100 mg | INTRAVENOUS | @ 07:00:00 | Stop: 2021-12-23 | NDC 00009001103

## 2021-12-23 MED ADMIN — SODIUM ZIRCONIUM CYCLOSILICATE 10 GRAM PO PWPK [337243]: 10 g | ORAL | @ 02:00:00 | Stop: 2021-12-23 | NDC 00310111001

## 2021-12-23 MED ADMIN — CALCIUM CHLORIDE 100 MG/ML (10 %) IV SOLN [1306]: 1 g | INTRAVENOUS | @ 03:00:00 | Stop: 2021-12-23 | NDC 00517671001

## 2021-12-23 MED ADMIN — HYDROCORTISONE SOD SUCC (PF) 100 MG/2 ML IJ SOLR [302326]: 50 mg | INTRAVENOUS | @ 11:00:00 | Stop: 2021-12-23 | NDC 00009001103

## 2021-12-23 MED ADMIN — PIPERACILLIN-TAZOBACTAM 4.5 GRAM IV SOLR [80419]: 4.5 g | INTRAVENOUS | @ 05:00:00 | NDC 60505615900

## 2021-12-23 MED ADMIN — ALBUMIN, HUMAN 5 % IV SOLP [8982]: 500 mL | INTRAVENOUS | @ 06:00:00 | Stop: 2021-12-24 | NDC 68516521403

## 2021-12-23 MED ADMIN — CALCIUM CHLORIDE 100 MG/ML (10 %) IV SOLN [1306]: 1 g | INTRAVENOUS | @ 12:00:00 | Stop: 2021-12-23 | NDC 00517671001

## 2021-12-23 MED ADMIN — SODIUM BICARBONATE 1 MEQ/ML (8.4 %) IV SOLN [7309]: 50 meq | INTRAVENOUS | @ 09:00:00 | Stop: 2021-12-23 | NDC 00409662522

## 2021-12-23 MED ADMIN — SODIUM CHLORIDE 0.9 % IV SOLP [27838]: 1 g | INTRAVENOUS | @ 03:00:00 | Stop: 2021-12-23 | NDC 00338004938

## 2021-12-23 MED ADMIN — HYDROMORPHONE (PF) 2 MG/ML IJ SYRG [163476]: 2 mg | INTRAVENOUS | @ 18:00:00 | Stop: 2021-12-23 | NDC 00409131203

## 2021-12-23 MED ADMIN — FUROSEMIDE 10 MG/ML IJ SOLN [3291]: 60 mg | INTRAVENOUS | @ 05:00:00 | Stop: 2021-12-23 | NDC 63323028001

## 2021-12-23 MED ADMIN — SODIUM CHLORIDE 0.9 % IV SOLP [27838]: 1 g | INTRAVENOUS | @ 12:00:00 | Stop: 2021-12-23 | NDC 00338004938

## 2021-12-23 MED ADMIN — FUROSEMIDE 10 MG/ML IJ SOLN [3291]: 20 mg | INTRAVENOUS | @ 02:00:00 | Stop: 2021-12-23 | NDC 63323028001

## 2021-12-23 MED ADMIN — ALBUMIN, HUMAN 25 % IV SOLP [8981]: 25 g | INTRAVENOUS | Stop: 2021-12-23 | NDC 00944049302

## 2021-12-23 MED ADMIN — PIPERACILLIN-TAZOBACTAM 4.5 GRAM IV SOLR [80419]: 4.5 g | INTRAVENOUS | @ 11:00:00 | Stop: 2021-12-23 | NDC 60505615900

## 2021-12-23 MED ADMIN — SODIUM CHLORIDE 0.9 % IV PGBK (MB+) [95161]: 4.5 g | INTRAVENOUS | @ 11:00:00 | Stop: 2021-12-23 | NDC 00338915930

## 2022-01-07 DEATH — deceased

## 2022-05-30 ENCOUNTER — Encounter: Admit: 2022-05-30 | Discharge: 2022-05-30

## 2022-10-21 ENCOUNTER — Encounter: Admit: 2022-10-21 | Discharge: 2022-10-21

## 2023-03-16 ENCOUNTER — Encounter: Admit: 2023-03-16 | Discharge: 2023-03-16 | Payer: BC Managed Care – PPO

## 2023-09-16 IMAGING — CT CHEST W(Adult)
2 of 4 series · 11 of 35 positions shown, 13 images · non-contrast
Comparison: none

[Series 4: thorax cor 1.50 br40 s3 · coronal · 0.57mm/px · 3 of 5 slices shown]
[im 2/5  lung]
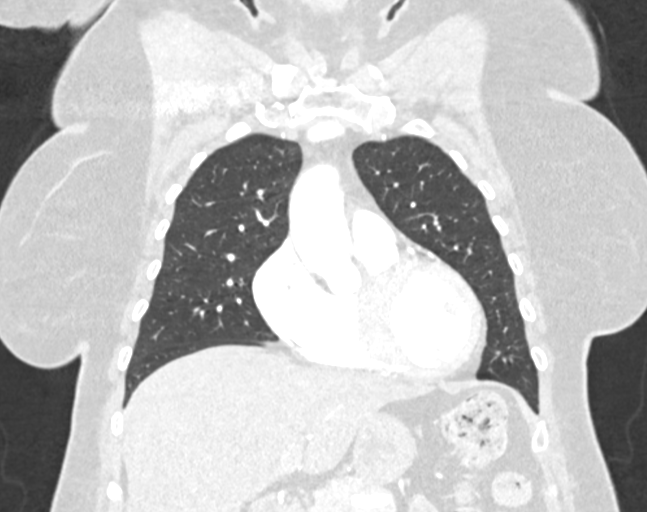
[im 3/5  lung]
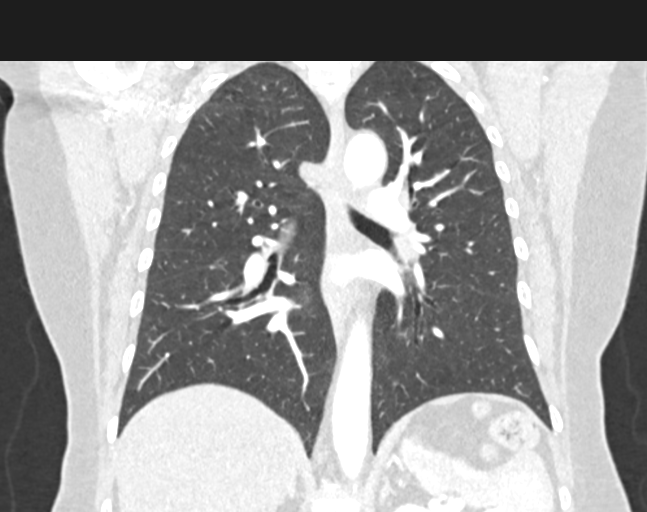
[im 4/5  lung]
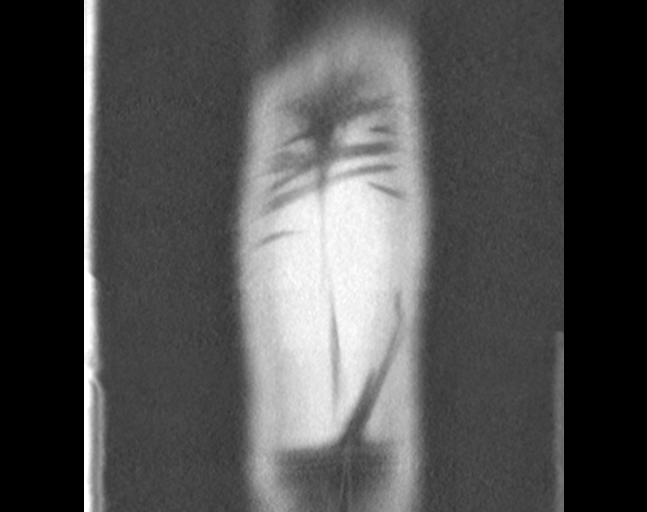

[Series 8: thorax ax 1.50 br60 s3 · axial · 0.52mm/px · z∈[+1513,+1746]mm · 8 of 33 slices shown, 10 images]
[im 3/33  mediastinal]
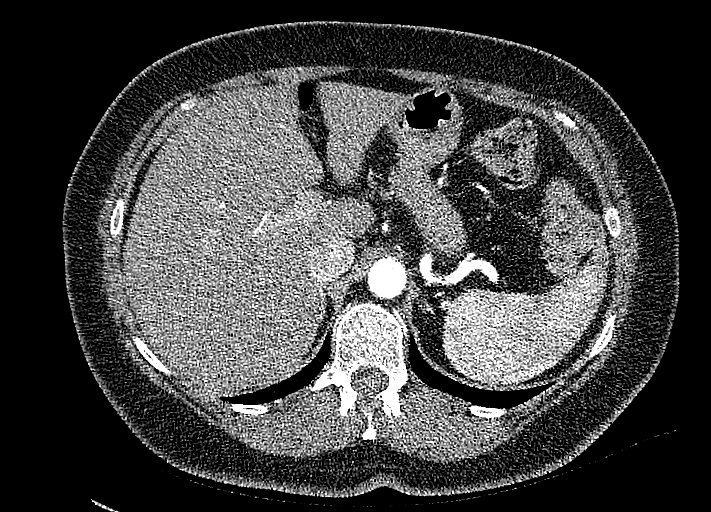
[im 3/33  lung]
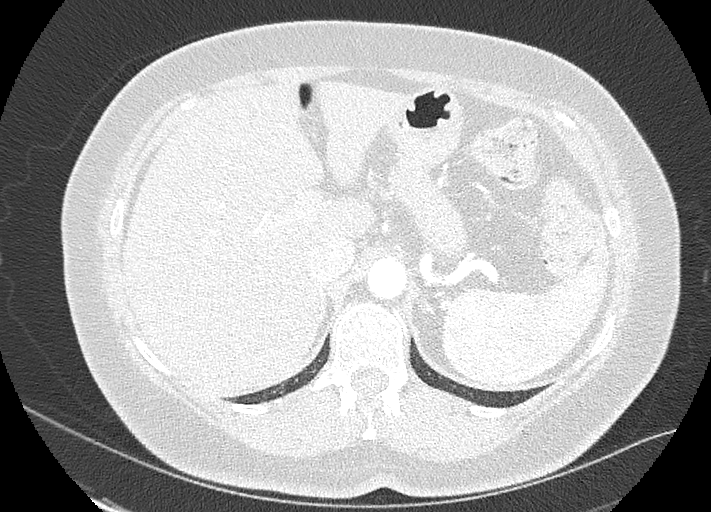
[im 6/33  lung]
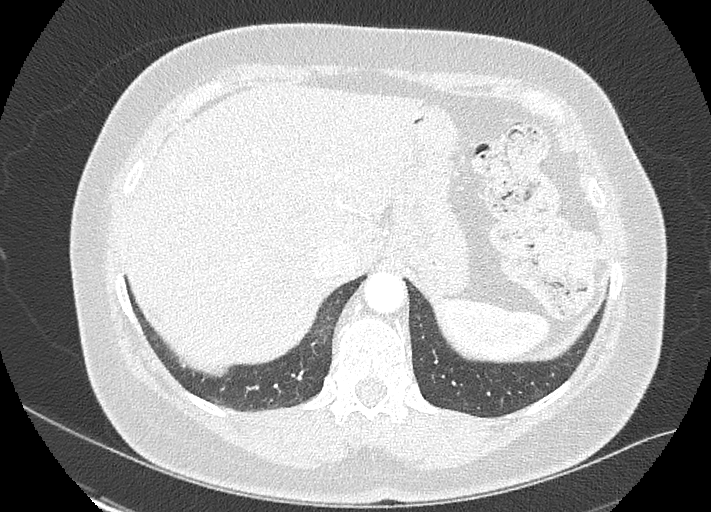
[im 11/33  lung]
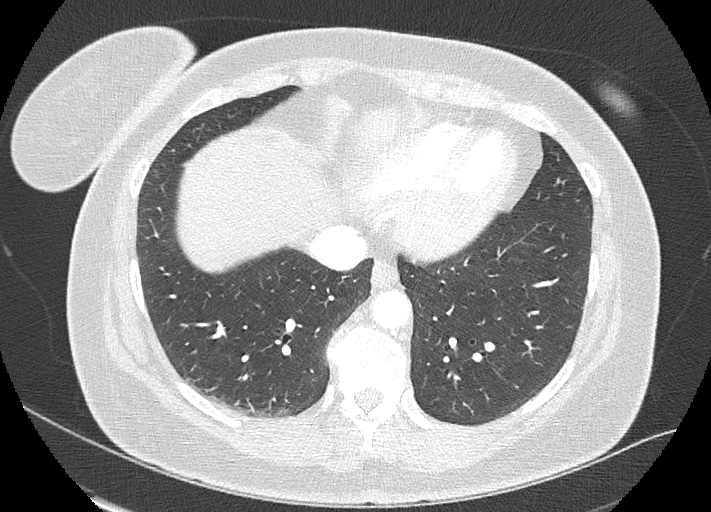
[im 15/33  lung]
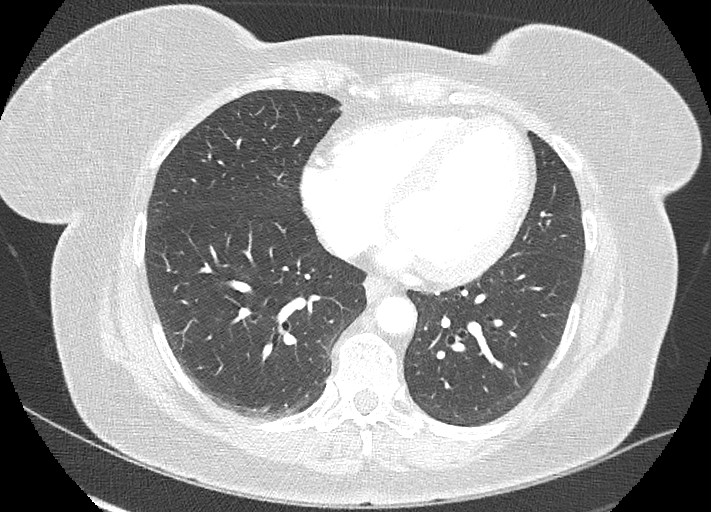
[im 18/33  mediastinal]
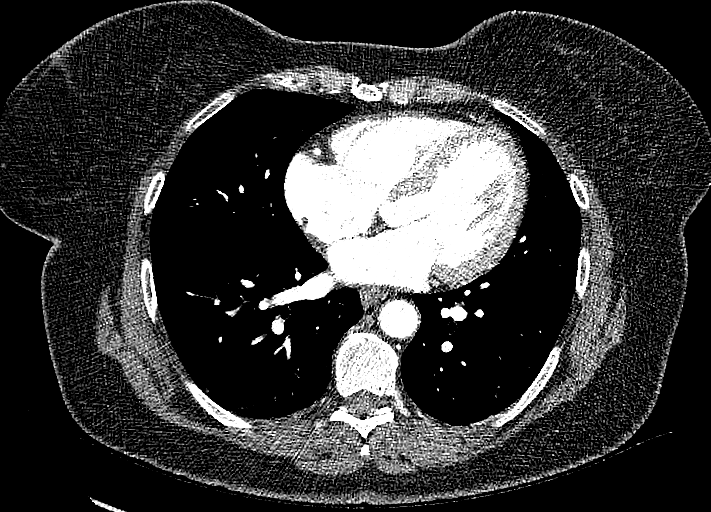
[im 18/33  lung]
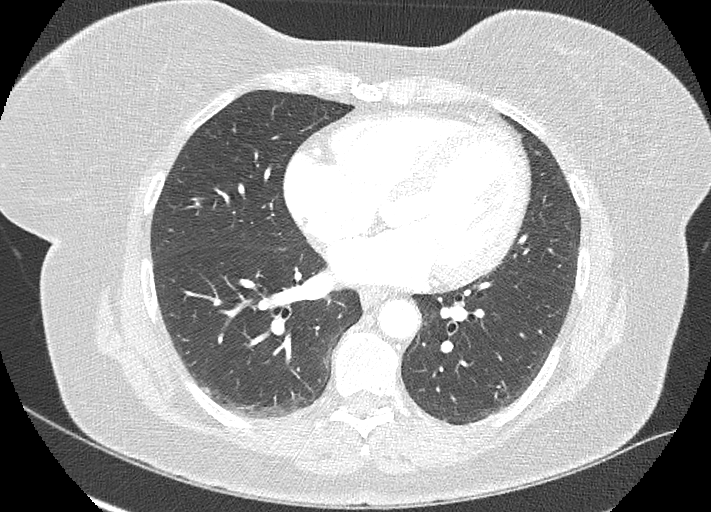
[im 22/33  lung]
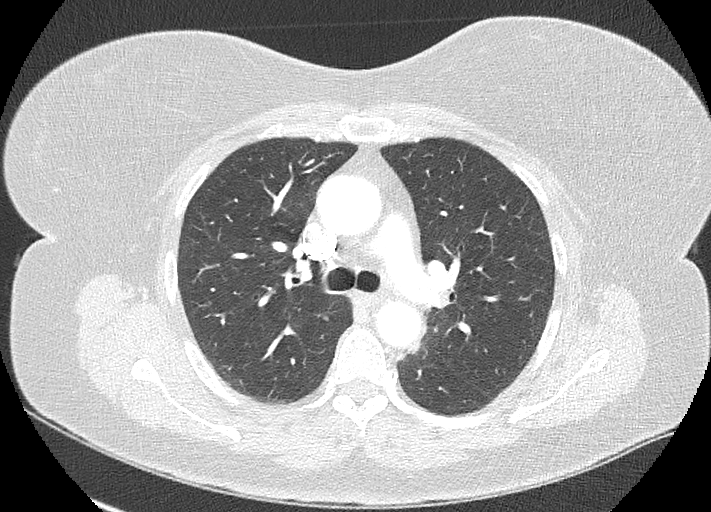
[im 27/33  lung]
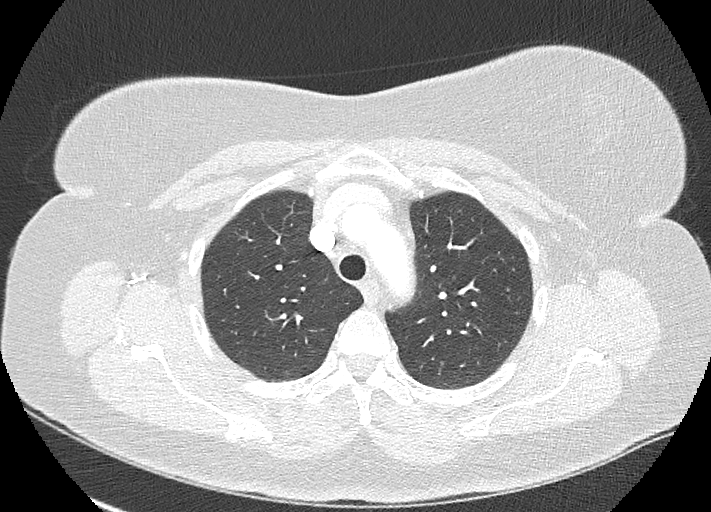
[im 30/33  lung]
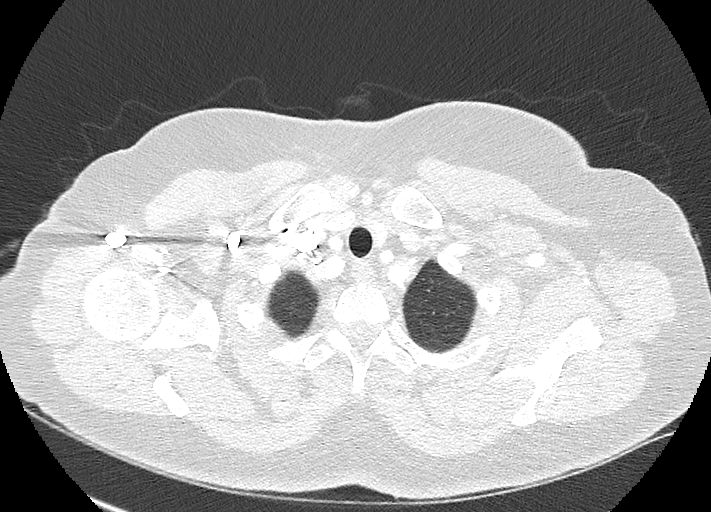

[11 of 35 positions shown; findings below may reference images not displayed]

DIAGNOSTIC STUDIES

EXAM

CT CHEST WITH CONTRAST

INDICATION

PATIENT COMPLAINS OF the CHRONIC COUGH THAT IS NOT IMPROVING. ABNORMAL CXR. PHDFR-E.73, 1YY-ZZ. PT

TECHNIQUE

Contiguous transaxial imaging through the chest were obtained. Coronal and sagittal reformations
were obtained from the transaxial source data.

All CT scans at this facility use dose modulation, iterative reconstruction, and/or weight based
dosing when appropriate to reduce radiation dose to as low as reasonably achievable.

IV CONTRAST: 75 mL of low osmolar intravenous iodinated contrast material was administered

COMPARISONS

Reference is made to two-view chest dated 03/26/2021.

Number of previous computed tomography exams in the last 12 months is 0 .

Number of previous nuclear medicine myocardial perfusion studies in the last 12 months is 0 .

FINDINGS

The trachea lies in the midline. The thyroid gland is normal. Bovine arch anatomy is noted. There
is no evidence of significant plaque formation along the origin of the great vessels..

The main pulmonary artery and the right and left pulmonary arteries are normal in caliber and
contour. There are no focal filling defects to suggest pulmonary thromboembolism.

Heart size is normal. There is no evidence of coronary artery calcifications. Thoracic aorta is
normal in course and caliber. There is no evidence of axillary or hilar adenopathy. There is no
evidence of peribronchial thickening.

Lungs show no concerning nodules, masses, or opacities. There are no pleural or pericardial
effusions. Calcified granuloma in the lingula is present.

Limited views of the upper abdomen demonstrate hepatic steatosis. The gallbladder is surgically
absent review of bone windows shows no destructive lytic or blastic bone lesions.

IMPRESSION

CT scan of the chest appears  within normal limits. No evidence of peribronchial thickening is
noted. Hepatic steatosis. Surgically absent gallbladder.

Tech Notes:

C/O CHRONIC COUGH THAT IS NOT IMPROVING. ABNORMAL CXR. PHDFR-E.73, 1YY-ZZ. PT GIVEN 100 CC [id number]
BY IV.
HB

## 2023-11-17 IMAGING — CR SHOULDCMRT
1 series · 1 of 1 positions shown · non-contrast
Comparison: none

[shoulder external]
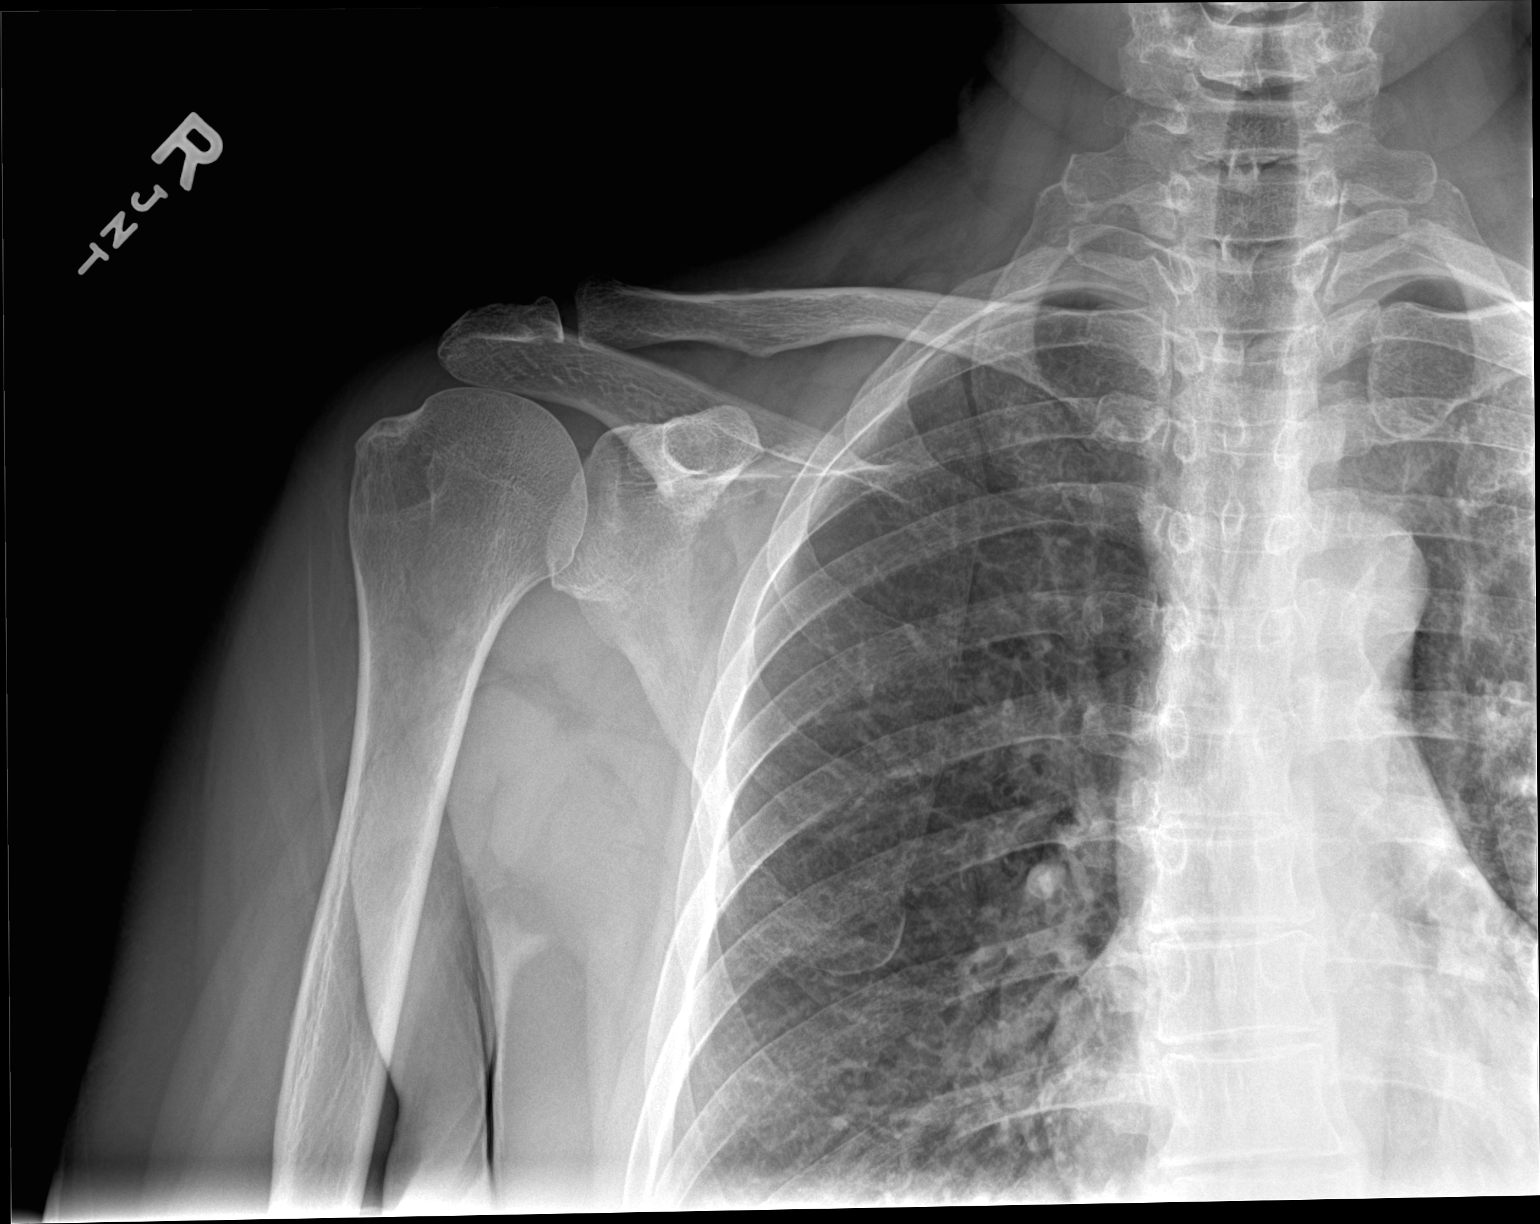

[1 of 1 positions shown; findings below may reference images not displayed]

DIAGNOSTIC STUDIES

EXAM

XR shoulder right, complete

INDICATION

right shoulder pain
Pt c/o right shoulder pain x a few weeks, pt states her pain has gotten worse and radiates to her
neck and down her arm, pt has limited ROM, no known injury. JT/KF

TECHNIQUE

Internal external rotated views lateral views and axillary views

COMPARISONS

None available

FINDINGS

No fractures or dislocations are seen. Joint spaces are well maintained.

IMPRESSION

Negative right shoulder.

Tech Notes:

Pt c/o right shoulder pain x a few weeks, pt states her pain has gotten worse and radiates to her
neck and down her arm, pt has limited ROM, no known injury. JT/KF
